# Patient Record
Sex: Female | Born: 1960 | Race: White | Hispanic: No | Smoking: Never smoker
Health system: Southern US, Community
[De-identification: ages and names within clinical notes are randomized; demographics above are authoritative.]

## PROBLEM LIST (undated history)

## (undated) DIAGNOSIS — R87619 Unspecified abnormal cytological findings in specimens from cervix uteri: Secondary | ICD-10-CM

## (undated) HISTORY — DX: Unspecified abnormal cytological findings in specimens from cervix uteri: R87.619

## (undated) HISTORY — PX: AUGMENTATION MAMMAPLASTY: SUR837

## (undated) HISTORY — PX: TONSILLECTOMY: SHX5217

---

## 1999-10-21 ENCOUNTER — Other Ambulatory Visit: Admission: RE | Admit: 1999-10-21 | Discharge: 1999-10-21 | Payer: Self-pay | Admitting: *Deleted

## 2000-02-21 HISTORY — PX: PLACEMENT OF BREAST IMPLANTS: SHX6334

## 2000-06-20 ENCOUNTER — Emergency Department (HOSPITAL_COMMUNITY): Admission: EM | Admit: 2000-06-20 | Discharge: 2000-06-21 | Payer: Self-pay | Admitting: *Deleted

## 2000-06-20 ENCOUNTER — Encounter: Payer: Self-pay | Admitting: Emergency Medicine

## 2000-06-26 ENCOUNTER — Emergency Department (HOSPITAL_COMMUNITY): Admission: EM | Admit: 2000-06-26 | Discharge: 2000-06-26 | Payer: Self-pay | Admitting: Emergency Medicine

## 2000-12-26 ENCOUNTER — Other Ambulatory Visit: Admission: RE | Admit: 2000-12-26 | Discharge: 2000-12-26 | Payer: Self-pay | Admitting: *Deleted

## 2001-12-31 ENCOUNTER — Other Ambulatory Visit: Admission: RE | Admit: 2001-12-31 | Discharge: 2001-12-31 | Payer: Self-pay | Admitting: Obstetrics and Gynecology

## 2002-02-03 ENCOUNTER — Encounter: Payer: Self-pay | Admitting: Obstetrics and Gynecology

## 2002-02-03 ENCOUNTER — Ambulatory Visit (HOSPITAL_COMMUNITY): Admission: RE | Admit: 2002-02-03 | Discharge: 2002-02-03 | Payer: Self-pay | Admitting: Obstetrics and Gynecology

## 2005-12-22 ENCOUNTER — Other Ambulatory Visit: Admission: RE | Admit: 2005-12-22 | Discharge: 2005-12-22 | Payer: Self-pay | Admitting: Family Medicine

## 2008-07-27 ENCOUNTER — Emergency Department (HOSPITAL_BASED_OUTPATIENT_CLINIC_OR_DEPARTMENT_OTHER): Admission: EM | Admit: 2008-07-27 | Discharge: 2008-07-27 | Payer: Self-pay | Admitting: Emergency Medicine

## 2008-07-27 ENCOUNTER — Ambulatory Visit: Payer: Self-pay | Admitting: Diagnostic Radiology

## 2008-11-19 ENCOUNTER — Other Ambulatory Visit: Admission: RE | Admit: 2008-11-19 | Discharge: 2008-11-19 | Payer: Self-pay | Admitting: Family Medicine

## 2008-11-30 ENCOUNTER — Encounter: Admission: RE | Admit: 2008-11-30 | Discharge: 2008-11-30 | Payer: Self-pay | Admitting: Family Medicine

## 2012-08-20 DIAGNOSIS — R87619 Unspecified abnormal cytological findings in specimens from cervix uteri: Secondary | ICD-10-CM

## 2012-08-20 HISTORY — DX: Unspecified abnormal cytological findings in specimens from cervix uteri: R87.619

## 2012-09-04 ENCOUNTER — Other Ambulatory Visit: Payer: Self-pay | Admitting: Family Medicine

## 2012-09-04 DIAGNOSIS — Z780 Asymptomatic menopausal state: Secondary | ICD-10-CM

## 2012-10-21 HISTORY — PX: COLPOSCOPY W/ BIOPSY / CURETTAGE: SUR283

## 2012-12-06 ENCOUNTER — Encounter (HOSPITAL_COMMUNITY): Payer: Self-pay | Admitting: Pharmacist

## 2012-12-13 ENCOUNTER — Other Ambulatory Visit (HOSPITAL_COMMUNITY): Payer: Self-pay

## 2012-12-18 ENCOUNTER — Ambulatory Visit (HOSPITAL_COMMUNITY): Admission: RE | Admit: 2012-12-18 | Payer: 59 | Source: Ambulatory Visit | Admitting: Obstetrics and Gynecology

## 2012-12-18 ENCOUNTER — Encounter (HOSPITAL_COMMUNITY): Admission: RE | Payer: Self-pay | Source: Ambulatory Visit

## 2012-12-18 SURGERY — CONE BIOPSY, CERVIX
Anesthesia: Choice

## 2013-02-10 ENCOUNTER — Other Ambulatory Visit: Payer: Self-pay | Admitting: Obstetrics and Gynecology

## 2013-02-10 ENCOUNTER — Other Ambulatory Visit (HOSPITAL_COMMUNITY)
Admission: RE | Admit: 2013-02-10 | Discharge: 2013-02-10 | Disposition: A | Discharge status: Home / Self Care | Payer: 59 | Source: Ambulatory Visit | Attending: Obstetrics and Gynecology | Admitting: Obstetrics and Gynecology

## 2013-02-10 DIAGNOSIS — R87810 Cervical high risk human papillomavirus (HPV) DNA test positive: Secondary | ICD-10-CM | POA: Insufficient documentation

## 2013-02-10 DIAGNOSIS — Z124 Encounter for screening for malignant neoplasm of cervix: Secondary | ICD-10-CM | POA: Insufficient documentation

## 2013-02-10 DIAGNOSIS — Z1151 Encounter for screening for human papillomavirus (HPV): Secondary | ICD-10-CM | POA: Insufficient documentation

## 2013-05-13 ENCOUNTER — Ambulatory Visit (INDEPENDENT_AMBULATORY_CARE_PROVIDER_SITE_OTHER): Payer: 59 | Admitting: Nurse Practitioner

## 2013-05-13 ENCOUNTER — Encounter: Payer: Self-pay | Admitting: Nurse Practitioner

## 2013-05-13 VITALS — BP 120/74 | HR 96 | Ht 61.0 in | Wt 116.0 lb

## 2013-05-13 DIAGNOSIS — R87619 Unspecified abnormal cytological findings in specimens from cervix uteri: Secondary | ICD-10-CM

## 2013-05-13 DIAGNOSIS — E2839 Other primary ovarian failure: Secondary | ICD-10-CM

## 2013-05-13 DIAGNOSIS — E288 Other ovarian dysfunction: Secondary | ICD-10-CM

## 2013-05-13 NOTE — Progress Notes (Signed)
Patient ID: Cristina Wyatt, female   DOB: 04/04/1960, 53 y.o.   MRN: [id number] 53 y.o. G2P2002 Divorced Caucasian Fe here to re-establish care for history of abnormal pap.  She has been a patient here several years ago - chart is in storage.  She has history of POF at age 18 with confirmation by labs at age 67.  Initially had vaso symptoms that stopped after age 43.  She is now divorced and in a monogamous relationship for 8 years.  There was a lapse of health care secondary to insurance as well.  At her most recent AEX in July 2014 she was found to have abnormal pap.  She was then seen by Dr. 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  She then had repeat pap 01/2013 and it showed ASCUS with + HR HPV and negative 16 & 18.  She comes back here now for a follow 3 month pap.    Patient's last menstrual period was 07/21/1996.          Sexually active: yes  The current method of family planning is post menopausal status.    Exercising: yes  Ballroom dancing Smoker:  no  Health Maintenance: Pap:  02/10/13, ASCUS, pos HR HPV, neg 16/18 MMG:  11/30/08, Bi-Rads 1: negative--per EPIC records pt unsure Colonoscopy:  Age 25 BMD:  11/30/08, low bone mass TDaP:  2007  Labs:  PCP 08/2012   reports that she has never smoked. She has never used smokeless tobacco. She reports that she drinks alcohol. She reports that she does not use illicit drugs.  History reviewed. No pertinent past medical history.  Past Surgical History  Procedure Laterality Date  . Tonsillectomy  age 33    No current outpatient prescriptions on file.   No current facility-administered medications for this visit.    Family History  Problem Relation Age of Onset  . Hypertension Father   . Diabetes Other   . Aneurysm Other     ROS:  Pertinent items are noted in HPI.  Otherwise, a comprehensive ROS was negative.  Exam:   BP 120/74   Pulse 96  Ht 5\' 1"  (1.549 m)  Wt 116 lb (52.617 kg)  BMI 21.93 kg/m2  LMP 07/21/1996 Height: 5\' 1"  (154.9 cm)  Ht Readings from Last 3 Encounters:  05/13/13 5\' 1"  (1.549 m)    General appearance: alert, cooperative and appears stated age Heart: regular rate and rhythm Abdomen: soft, non-tender; no masses,  no organomegaly    Pelvic: External genitalia:  no lesions              Urethra:  normal appearing urethra with no masses, tenderness or lesions              Bartholin's and Skene's: normal                 Vagina: normal appearing vagina with normal color and discharge, no lesions              Cervix: anteverted              Pap taken: yes Bimanual Exam:  Uterus:  normal size, contour, position, consistency, mobility, non-tender              Adnexa: no mass, fullness, tenderness               Rectovaginal: not examined  Anus:  Not examined  A:  Follow up on abnormal pap  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   [facility] and [facility] are scanned into Epic.

## 2013-05-13 NOTE — Patient Instructions (Signed)

## 2013-05-13 NOTE — Progress Notes (Signed)
Encounter reviewed by Dr. Kirstan Fentress Silva.  

## 2013-05-16 LAB — IPS PAP TEST WITH HPV

## 2013-05-19 ENCOUNTER — Other Ambulatory Visit: Payer: Self-pay | Admitting: Nurse Practitioner

## 2013-05-19 ENCOUNTER — Telehealth: Payer: Self-pay | Admitting: Nurse Practitioner

## 2013-05-19 DIAGNOSIS — R87619 Unspecified abnormal cytological findings in specimens from cervix uteri: Secondary | ICD-10-CM

## 2013-05-19 NOTE — Telephone Encounter (Signed)
Left a message on cell phone for patient to call back about labs.  This is to discuss her pap smear results.

## 2013-05-19 NOTE — Telephone Encounter (Signed)
12:42 PM Called again and left message of voice mail to call us.

## 2013-05-20 ENCOUNTER — Encounter: Payer: Self-pay | Admitting: *Deleted

## 2013-05-20 NOTE — Telephone Encounter (Signed)
05/20/13 - called her work number and left a message for her to call back to discuss labs.  This is about need for a Leep which we have tentatively scheduled for Wednesday with Dr. Hyacinth MeekerMiller.

## 2013-05-21 ENCOUNTER — Ambulatory Visit (INDEPENDENT_AMBULATORY_CARE_PROVIDER_SITE_OTHER): Payer: 59 | Admitting: Obstetrics & Gynecology

## 2013-05-21 ENCOUNTER — Encounter: Payer: Self-pay | Admitting: Obstetrics & Gynecology

## 2013-05-21 VITALS — BP 124/76 | HR 88 | Ht 61.0 in | Wt 117.0 lb

## 2013-05-21 DIAGNOSIS — R87619 Unspecified abnormal cytological findings in specimens from cervix uteri: Secondary | ICD-10-CM

## 2013-05-21 NOTE — Progress Notes (Signed)
Subjective:     Patient ID: Cristina RoesVicki Wyatt, female   DOB: 05/07/1960, 53 y.o.   MRN: [id number]  HPI 53 yo G2P2 prior patient of our practice who has most recently [facility].  Pt had abnormal Pap smear in 7/14 with AEX.  Colposopcy and ECC done 10/22/12 showed squamous dysplasia, favoring high grade SIL.  Repeat pap 12/14 was ASCUS with +HR HPV.  Pt worried about management and came for second opinion.  I have reviewed all pathology with her and really feel with the high grade findings on ECC, that LEEP is the correct management.  Pt feels since her last pap was normal, she is anxious about being this aggressive.  D/w pt proceeding with colposcopy with the knowledge that we might be right back doing the LEEP in another week.  This is her preference.   Review of Systems  All other systems reviewed and are negative.       Objective:   Physical Exam  Constitutional: She is oriented to person, place, and time. She appears well-developed and well-nourished.  Genitourinary:    Neurological: She is alert and oriented to person, place, and time.  Skin: Skin is warm and dry.  Psychiatric: She has a normal mood and affect.   Speculum placed.  3% acetic acid applied to cervix for >45 seconds.  Cervix visualized with both 7.5X and 15X magnification.  Green filter also used.  Lugols solution was used.  Findings:  Noted above.  Biopsy:  3, 8, and 10.  ECC:  was performed.  Monsel's was needed.  Excellent hemostasis was present.  Pt tolerated procedure well and all instruments were removed.  Findings noted above on picture of cervix.     Assessment:     Normal pap with +HR HPV Colpo with ECC + for possible High grade dysplasia 9/14     Plan:     Biopsies and ECC pending.  If CIN 2 or greater, will have pt return for LEEP Even if CIN 1 or less, will plan f/u Pap 6 months due to hx      In addition to procedure, about 10 minutes spent discussing hx and prior paps/pathology and current  guideline recommendations.

## 2013-05-21 NOTE — Patient Instructions (Signed)

## 2013-05-27 NOTE — Addendum Note (Signed)
Addended by: Jerene BearsMILLER, Kaoir Loree S on: 05/27/2013 02:43 AM   Modules accepted: Orders

## 2013-05-29 LAB — IPS OTHER TISSUE BIOPSY

## 2013-05-30 ENCOUNTER — Other Ambulatory Visit: Payer: Self-pay | Admitting: *Deleted

## 2013-05-30 ENCOUNTER — Other Ambulatory Visit: Payer: Self-pay | Admitting: Obstetrics & Gynecology

## 2013-05-30 DIAGNOSIS — IMO0002 Reserved for concepts with insufficient information to code with codable children: Secondary | ICD-10-CM

## 2013-05-30 DIAGNOSIS — N871 Moderate cervical dysplasia: Secondary | ICD-10-CM

## 2013-06-02 ENCOUNTER — Telehealth: Payer: Self-pay | Admitting: *Deleted

## 2013-06-02 NOTE — Telephone Encounter (Signed)
Patient called to reschedule LEEP due to work schedule.  LEEP rescheduled for 06-20-13 at 3pm.  Patient requested a late afternoon on Thursday with a weekend. Advised Dr Hyacinth MeekerMiller with ultrasounds Thursday afternoon. Offered Fri 06-20-13 at 3pm. Patient agreeable if would be able to return to work on Monday. Advised most people are fine to retrun to work the following day so I would not anticipate a problem returning on Monday following Friday procedure.   Routing to provider for final review. Patient agreeable to disposition. Will close encounter

## 2013-06-05 ENCOUNTER — Ambulatory Visit: Payer: 59 | Admitting: Obstetrics & Gynecology

## 2013-06-09 ENCOUNTER — Telehealth: Payer: Self-pay | Admitting: Obstetrics & Gynecology

## 2013-06-09 NOTE — Telephone Encounter (Signed)
Left message for patient to call back. Need to provide benefits quote for LEEP (PR $[phone])

## 2013-06-09 NOTE — Telephone Encounter (Signed)
Advised patient of out of pocket expectation for LEEP. Patient agreeable.

## 2013-06-09 NOTE — Telephone Encounter (Signed)
Patient is calling sabrina back °

## 2013-06-20 ENCOUNTER — Ambulatory Visit (INDEPENDENT_AMBULATORY_CARE_PROVIDER_SITE_OTHER): Payer: 59 | Admitting: Obstetrics & Gynecology

## 2013-06-20 ENCOUNTER — Telehealth: Payer: Self-pay | Admitting: *Deleted

## 2013-06-20 VITALS — BP 116/64 | HR 80 | Resp 16 | Wt 119.6 lb

## 2013-06-20 DIAGNOSIS — N871 Moderate cervical dysplasia: Secondary | ICD-10-CM

## 2013-06-20 HISTORY — PX: LEEP: SHX91

## 2013-06-20 NOTE — Telephone Encounter (Signed)
Patient did not receive message and arrived on time for appointment  Routing to provider for final review.  Will close encounter

## 2013-06-20 NOTE — Telephone Encounter (Signed)
[name], LMTCB.  Asking patient to come at 2pm if possible.  Per ROI, ok to leave detailed message.

## 2013-06-20 NOTE — Progress Notes (Signed)
53 y.o. DivorcedWF G2P2 here for LEEP.  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  Treatment was recommended and pt here for this.    Patient's last menstrual period was 07/21/1996. Pt is PMP.  Pre-procedure vitals: Blood pressure 116/64, pulse 80, resp. rate 16, weight 119 lb 9.6 oz (54.25 kg), last menstrual period 07/21/1996.   Procedure explained and patient's questions were invited and answered.   Consent form signed.  Pre-procedure medication:  Motrin 800mg .  Time given:  3pm  Procedure Set-up: Grounding pad located left thigh.  Cautery settings:50 cut/50coagulation.  Suction applied to coated speculum.  Procedure:  Speculum placed with good visualization of the cervix.  Colposcopy performed showing:  acetowhite lesion(s) noted at 3 and 8 o'clock and some vascular changes at 10.  No change from prior colposcopy. Cervix anesthetized using 2% Xylocaine with 1:100,000units Epinephrine.  10 cc's used.Entire transition zone with 12x15 loop in 4passes.  Deeper specimen obtained with 10x1010mm LEEP.  Specimen(s) placed on cork and labeled for pathology.  Hemostasis obtained with ball cautery and Monsel's solution.  EBL:  Minimal  Complications:none  Patient tolerated procedure well and left the office in satisfactory condition.  Plan:  After visit summary given.  Repeat pap planned depending on pathology

## 2013-06-20 NOTE — Patient Instructions (Signed)

## 2013-06-25 LAB — IPS OTHER TISSUE BIOPSY

## 2013-06-27 ENCOUNTER — Telehealth: Payer: Self-pay | Admitting: Obstetrics & Gynecology

## 2013-06-27 NOTE — Telephone Encounter (Signed)
I agree.  I spent 20 minutes with pt on the phone this morning and she did not mention this at all.   Encounter closed.

## 2013-06-27 NOTE — Telephone Encounter (Signed)
Patient had a leep procedure last friday and wants to know if it is normal to have swelling in the abdomen

## 2013-06-27 NOTE — Telephone Encounter (Signed)
Spoke with patient. Advised to monitor abdominal swelling over the weekend. Drink plenty of fluids and walk around to get bowels active. If having trouble having a bowel movement can use a stool softener. Monitor for signs of infection such as abdominal pain, fevers, discharge, nausea, and vomiting. If abdominal swelling increases or any of these other symptoms occur seek emergent care over the weekend. Can call our practice to get in contact with doctor on call over the weekend or seek immediate care in area close to her. If no other symptoms have occurred but still has abdominal swelling on Monday and is concerned we can see her in our office at that time. Patient agreeable and verbalizes understanding.  Dr.Miller, any further advice or recommendations for patient? Okay to continue with this plan?

## 2013-06-27 NOTE — Telephone Encounter (Signed)
Spoke with patient. Patient states that she spoke with Dr.Miller on the phone this morning but forgot to mention her abdominal swelling. Patient had LEEP on 06/20/13 with Dr.Miller. Swelling has been going on for a couple of days. "I can't even suck in my gut to get my pants on." Denies abdominal pain, urinary symptoms, bowel symptoms, and bleeding. Abdomen is soft to the touch and is not tender. "It has doubled in size from yesterday to today." Patient has been taking ibuprofen for swelling but has not taken any since yesterday. Advised would check with Dr.Miller about symptoms and give patient a call back with office visit information or further instructions. Patient agreeable.

## 2013-12-22 ENCOUNTER — Encounter: Payer: Self-pay | Admitting: Obstetrics & Gynecology

## 2013-12-23 ENCOUNTER — Telehealth: Payer: Self-pay | Admitting: Obstetrics & Gynecology

## 2013-12-23 NOTE — Telephone Encounter (Signed)
Pt calling to speak with someone about a credit that is on her account?

## 2013-12-23 NOTE — Telephone Encounter (Signed)
Patient called to get clarification on what her appointment is for on 11/10. It looks like an office visit only, but she said she wants to be sure. Would you mind assisting her?

## 2013-12-23 NOTE — Telephone Encounter (Signed)
Spoke with patient, she is coming in for a 6 month pap smear. States she needs to know how much this visit will be. Morrie Sheldonshley is checking on the lab portion, will you help me find out how much our portion will cost and let patient know on Weds? States she has a credit with us and asking about us cutting her a check to pay the lab part?

## 2013-12-30 ENCOUNTER — Ambulatory Visit (INDEPENDENT_AMBULATORY_CARE_PROVIDER_SITE_OTHER): Payer: 59 | Admitting: Obstetrics & Gynecology

## 2013-12-30 VITALS — BP 122/80 | HR 72 | Resp 16 | Wt 121.2 lb

## 2013-12-30 DIAGNOSIS — N871 Moderate cervical dysplasia: Secondary | ICD-10-CM

## 2013-12-31 ENCOUNTER — Encounter: Payer: Self-pay | Admitting: Obstetrics & Gynecology

## 2013-12-31 NOTE — Progress Notes (Signed)
Subjective:     Patient ID: Cristina RoesVicki Wyatt, female   DOB: 12/12/1960, 53 y.o.   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  Pt has a normal pap prior with +HRHPV.  D/W pt prior hx.  She has a lot of anxiety around this and needs to be very clear about her "plan" and what will come next if pap is abnormal.  Discussed with pt all kinds of variations of abnormal pap smears and the follow-up for each if an abnormality is present.  All questions answered.  Review of Systems  All other systems reviewed and are negative.      Objective:   Physical Exam  Constitutional: She is oriented to person, place, and time. She appears well-developed and well-nourished.  Genitourinary: Vagina normal. There is no rash, tenderness or lesion on the right labia. There is no rash, tenderness or lesion on the left labia.  Cervix is well healed.  Os stenotic.  Pap obtained.  Lymphadenopathy:       Right: No inguinal adenopathy present.       Left: No inguinal adenopathy present.  Neurological: She is alert and oriented to person, place, and time.  Skin: Skin is warm and dry.  Psychiatric: She has a normal mood and affect.       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  If normal, f/u pap with HR HPV 6 months.  ~15 minutes spent with patient >50% of time was in face to face discussion of above in addition to physical exam.

## 2014-01-01 LAB — IPS PAP SMEAR ONLY

## 2014-01-05 ENCOUNTER — Telehealth: Payer: Self-pay

## 2014-01-05 NOTE — Telephone Encounter (Signed)
Lmtcb//kn 

## 2014-01-05 NOTE — Telephone Encounter (Signed)
Pt returning call. Says she is available Tuesday or Wednesday the last appointment of the day or first after lunch.

## 2014-01-05 NOTE — Telephone Encounter (Signed)
-----   Message from Annamaria BootsMary Suzanne Miller, MD sent at 01/04/2014  2:29 PM EST ----- Left detailed message for pt that pap was normal.  Needs pap and HR HPV with AEX, 6 months.  Needs to be in 08 recall as well.

## 2014-01-06 NOTE — Telephone Encounter (Signed)
Patient notified of results. AEX/6 month pap scheduled for 06/30/14//kn

## 2014-01-12 ENCOUNTER — Other Ambulatory Visit: Payer: Self-pay

## 2014-01-12 DIAGNOSIS — Z1231 Encounter for screening mammogram for malignant neoplasm of breast: Secondary | ICD-10-CM

## 2014-01-14 ENCOUNTER — Ambulatory Visit: Payer: 59

## 2014-01-14 DIAGNOSIS — Z1231 Encounter for screening mammogram for malignant neoplasm of breast: Secondary | ICD-10-CM

## 2014-04-09 ENCOUNTER — Telehealth: Payer: Self-pay | Admitting: Obstetrics & Gynecology

## 2014-06-30 ENCOUNTER — Encounter: Payer: Self-pay | Admitting: Obstetrics & Gynecology

## 2014-06-30 ENCOUNTER — Ambulatory Visit (INDEPENDENT_AMBULATORY_CARE_PROVIDER_SITE_OTHER): Payer: 59 | Admitting: Obstetrics & Gynecology

## 2014-06-30 VITALS — BP 120/78 | HR 76 | Resp 12 | Ht 61.0 in | Wt 122.2 lb

## 2014-06-30 DIAGNOSIS — Z01419 Encounter for gynecological examination (general) (routine) without abnormal findings: Secondary | ICD-10-CM | POA: Diagnosis not present

## 2014-06-30 DIAGNOSIS — Z124 Encounter for screening for malignant neoplasm of cervix: Secondary | ICD-10-CM

## 2014-06-30 NOTE — Progress Notes (Signed)
54 y.o. [id number] DivorcedCaucasianF here for annual exam.  No vaginal bleeding.  Had lots of blood work for health insurance done done in march.  Pt reports this was all normal.     Patient's last menstrual period was 07/21/1996.          Sexually active: Yes.    The current method of family planning is post menopausal status.    Exercising: Yes.    farm work with horses, ballroom dancing Smoker:  no  Health Maintenance: Pap:  12/30/13 WNL History of abnormal Pap:  Yes h/o LEEP MMG:  01/14/14-normal Colonoscopy:  Age 69-repeat at age 54.  [facility].  Release for records signed. BMD:   11/30/08 TDaP:  12/11/05 Screening Labs: PCP, Hb today: PCP, Urine today: PCP   reports that she has never smoked. She has never used smokeless tobacco. She reports that she drinks alcohol. She reports that she does not use illicit drugs.  Past Medical History  Diagnosis Date  . Abnormal Pap smear of cervix 08/2012    Past Surgical History  Procedure Laterality Date  . Tonsillectomy  age 89  . Colposcopy w/ biopsy / curettage  10/2012    path - high grade SIL    No current outpatient prescriptions on file.   No current facility-administered medications for this visit.    Family History  Problem Relation Age of Onset  . Hypertension Father   . Diabetes Other   . Aneurysm Other     ROS:  Pertinent items are noted in HPI.  Otherwise, a comprehensive ROS was negative.  Exam:   BP 120/78 mmHg  Pulse 76  Resp 12  Ht 5\' 1"  (1.549 m)  Wt 122 lb 3.2 oz (55.43 kg)  BMI 23.10 kg/m2  LMP 07/21/1996  Weight change: +6#  Height: 5\' 1"  (154.9 cm)  Ht Readings from Last 3 Encounters:  06/30/14 5\' 1"  (1.549 m)  05/21/13 5\' 1"  (1.549 m)  05/13/13 5\' 1"  (1.549 m)    General appearance: alert, cooperative and appears stated age Head: Normocephalic, without obvious abnormality, atraumatic Neck: no adenopathy, supple, symmetrical, trachea midline and thyroid normal to inspection and  palpation Lungs: clear to auscultation bilaterally Breasts: normal appearance, no masses or tenderness , bilateral implants Heart: regular rate and rhythm Abdomen: soft, non-tender; bowel sounds normal; no masses,  no organomegaly Extremities: extremities normal, atraumatic, no cyanosis or edema Skin: Skin color, texture, turgor normal. No rashes or lesions Lymph nodes: Cervical, supraclavicular, and axillary nodes normal. No abnormal inguinal nodes palpated Neurologic: Grossly normal   Pelvic: External genitalia:  no lesions              Urethra:  normal appearing urethra with no masses, tenderness or lesions              Bartholins and Skenes: normal                 Vagina: normal appearing vagina with normal color and discharge, no lesions              Cervix: no lesions              Pap taken: Yes.   Bimanual Exam:  Uterus:  normal size, contour, position, consistency, mobility, non-tender              Adnexa: normal adnexa and no mass, fullness, tenderness               Rectovaginal: Confirms  Anus:  normal sphincter tone, no lesions  Chaperone was present for exam.  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

## 2014-07-02 LAB — IPS PAP TEST WITH HPV

## 2015-09-17 ENCOUNTER — Ambulatory Visit (INDEPENDENT_AMBULATORY_CARE_PROVIDER_SITE_OTHER): Payer: 59 | Admitting: Obstetrics & Gynecology

## 2015-09-17 ENCOUNTER — Encounter: Payer: Self-pay | Admitting: Obstetrics & Gynecology

## 2015-09-17 VITALS — BP 110/72 | HR 92 | Resp 14 | Ht 60.5 in | Wt 112.0 lb

## 2015-09-17 DIAGNOSIS — Z205 Contact with and (suspected) exposure to viral hepatitis: Secondary | ICD-10-CM | POA: Diagnosis not present

## 2015-09-17 DIAGNOSIS — Z01419 Encounter for gynecological examination (general) (routine) without abnormal findings: Secondary | ICD-10-CM

## 2015-09-17 DIAGNOSIS — Z0000 Encounter for general adult medical examination without abnormal findings: Secondary | ICD-10-CM

## 2015-09-17 DIAGNOSIS — Z124 Encounter for screening for malignant neoplasm of cervix: Secondary | ICD-10-CM | POA: Diagnosis not present

## 2015-09-17 DIAGNOSIS — N871 Moderate cervical dysplasia: Secondary | ICD-10-CM | POA: Diagnosis not present

## 2015-09-17 DIAGNOSIS — Z23 Encounter for immunization: Secondary | ICD-10-CM | POA: Diagnosis not present

## 2015-09-17 NOTE — Progress Notes (Signed)
55 y.o. [id number] DivorcedCaucasianF here for annual exam.  Denies vaginal bleeding.  Pt has lost 11# since last year.  She has been working on this.    PCP:  Dr. [place].    Patient's last menstrual period was 07/21/1996.          Sexually active: Yes.    The current method of family planning is post menopausal status.    Exercising: Yes.    dancing, bowling, camping Smoker:  no  Health Maintenance: Pap:  06/30/14 Neg. HR HPV:neg History of abnormal Pap:  Yes, LGSIL. Hx of LEEP MMG:  01/19/14 BIRADS1:neg Colonoscopy:  12/01/08 Normal - f/u 10 years  BMD:   12/01/08 Osteopenia  TDaP:  11/2005  Pneumonia vaccine(s):  No Zostavax:   No Hep C testing: Counseled about Hep C testing Screening Labs: At work    reports that she has never smoked. She has never used smokeless tobacco. She reports that she drinks alcohol. She reports that she does not use drugs.  Past Medical History:  Diagnosis Date  . Abnormal Pap smear of cervix 08/2012    Past Surgical History:  Procedure Laterality Date  . COLPOSCOPY W/ BIOPSY / CURETTAGE  10/2012   path - high grade SIL  . TONSILLECTOMY  age 22    No current outpatient prescriptions on file.   No current facility-administered medications for this visit.     Family History  Problem Relation Age of Onset  . Hypertension Father   . Diabetes Other   . Aneurysm Other     ROS:  Pertinent items are noted in HPI.  Otherwise, a comprehensive ROS was negative.  Exam:   BP 128/86 (BP Location: Right Arm, Patient Position: Sitting, Cuff Size: Normal)   Pulse 92   Resp 14   Ht 5' 0.5" (1.537 m)   Wt 112 lb (50.8 kg)   LMP 07/21/1996   BMI 21.51 kg/m     Height: 5' 0.5" (153.7 cm)  Ht Readings from Last 3 Encounters:  09/17/15 5' 0.5" (1.537 m)  06/30/14  (1.549 m)  05/21/13  (1.549 m)   Recheck BP after resting a few minutes:  110/72  General appearance: alert, cooperative and appears stated age Head: Normocephalic, without  obvious abnormality, atraumatic Neck: no adenopathy, supple, symmetrical, trachea midline and thyroid normal to inspection and palpation Lungs: clear to auscultation bilaterally Breasts: normal appearance, no masses or tenderness, bilateral implants Heart: regular rate and rhythm Abdomen: soft, non-tender; bowel sounds normal; no masses,  no organomegaly Extremities: extremities normal, atraumatic, no cyanosis or edema Skin: Skin color, texture, turgor normal. No rashes or lesions Lymph nodes: Cervical, supraclavicular, and axillary nodes normal. No abnormal inguinal nodes palpated Neurologic: Grossly normal   Pelvic: External genitalia:  no lesions              Urethra:  normal appearing urethra with no masses, tenderness or lesions              Bartholins and Skenes: normal                 Vagina: normal appearing vagina with normal color and discharge, no lesions              Cervix: no lesions              Pap taken: Yes.   Bimanual Exam:  Uterus:  normal size, contour, position, consistency, mobility, non-tender  Adnexa: normal adnexa and no mass, fullness, tenderness               Rectovaginal: Confirms               Anus:  normal sphincter tone, no lesions  Chaperone was present for exam.  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  This was negative last year.   Labs with work this year Hep C antibody Tdap Return annually or prn

## 2015-09-18 LAB — HEPATITIS C ANTIBODY: HCV Ab: NEGATIVE

## 2015-09-22 LAB — IPS PAP TEST WITH HPV

## 2015-10-21 ENCOUNTER — Other Ambulatory Visit: Payer: Self-pay | Admitting: Obstetrics & Gynecology

## 2015-10-21 DIAGNOSIS — Z1231 Encounter for screening mammogram for malignant neoplasm of breast: Secondary | ICD-10-CM

## 2015-10-22 ENCOUNTER — Ambulatory Visit (INDEPENDENT_AMBULATORY_CARE_PROVIDER_SITE_OTHER): Payer: 59

## 2015-10-22 DIAGNOSIS — Z1231 Encounter for screening mammogram for malignant neoplasm of breast: Secondary | ICD-10-CM

## 2016-10-05 ENCOUNTER — Other Ambulatory Visit: Payer: Self-pay | Admitting: Obstetrics & Gynecology

## 2016-10-05 DIAGNOSIS — Z1231 Encounter for screening mammogram for malignant neoplasm of breast: Secondary | ICD-10-CM

## 2016-11-01 ENCOUNTER — Ambulatory Visit: Payer: 59

## 2016-11-01 ENCOUNTER — Ambulatory Visit (INDEPENDENT_AMBULATORY_CARE_PROVIDER_SITE_OTHER): Payer: 59

## 2016-11-01 DIAGNOSIS — Z1231 Encounter for screening mammogram for malignant neoplasm of breast: Secondary | ICD-10-CM

## 2016-11-09 ENCOUNTER — Encounter: Payer: Self-pay | Admitting: Family Medicine

## 2016-12-15 ENCOUNTER — Ambulatory Visit: Payer: 59 | Admitting: Obstetrics & Gynecology

## 2017-02-26 ENCOUNTER — Ambulatory Visit (INDEPENDENT_AMBULATORY_CARE_PROVIDER_SITE_OTHER): Payer: 59 | Admitting: Obstetrics & Gynecology

## 2017-02-26 ENCOUNTER — Other Ambulatory Visit: Payer: Self-pay

## 2017-02-26 ENCOUNTER — Other Ambulatory Visit (HOSPITAL_COMMUNITY)
Admission: RE | Admit: 2017-02-26 | Discharge: 2017-02-26 | Disposition: A | Discharge status: Home / Self Care | Payer: 59 | Source: Ambulatory Visit | Attending: Obstetrics & Gynecology | Admitting: Obstetrics & Gynecology

## 2017-02-26 ENCOUNTER — Encounter: Payer: Self-pay | Admitting: Obstetrics & Gynecology

## 2017-02-26 VITALS — BP 120/84 | HR 68 | Resp 14 | Ht 60.5 in | Wt 99.2 lb

## 2017-02-26 DIAGNOSIS — Z01419 Encounter for gynecological examination (general) (routine) without abnormal findings: Secondary | ICD-10-CM

## 2017-02-26 DIAGNOSIS — E2839 Other primary ovarian failure: Secondary | ICD-10-CM

## 2017-02-26 DIAGNOSIS — Z124 Encounter for screening for malignant neoplasm of cervix: Secondary | ICD-10-CM

## 2017-02-26 NOTE — Progress Notes (Signed)
57 y.o. [id number] DivorcedCaucasianF here for annual exam.  Doing well.  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  D/w pt BMI and weight loss over last two years.    Denies vaginal bleeding.  Thinks she may have cracked two ribs--one about a year ago and then about three weeks.  No pain with deep inspiration or cough.  Pt feels like she did have pain with inspiration about a week ago.  She also had horse step on her finger three weeks ago.  Has been swollen for the last three weeks.  Cannot tighten fist completely.  Patient's last menstrual period was 07/21/1996.          Sexually active: Yes.    The current method of family planning is post menopausal status.    Exercising: Yes.    The patient has a physically strenuous job, but has no regular exercise apart from work.  Smoker:  no  Health Maintenance: Pap:  09/20/15 Neg. HR HPV:neg   06/30/14 Neg. HR HPV:neg  History of abnormal Pap:  Yes, LEEP  MMG:  11/02/16 BIRADS1:neg  Colonoscopy:  2010 normal. F/u 10 years  BMD:   12/01/08 Osteopenia  TDaP:  2017 Pneumonia vaccine(s):  never Shingrix:   never Hep C testing: 09/17/15 Neg  Screening Labs: done 10/2016- has a copy today, Hb today: same, Urine today: not collected    reports that  has never smoked. she has never used smokeless tobacco. She reports that she drinks alcohol. She reports that she does not use drugs.  Past Medical History:  Diagnosis Date  . Abnormal Pap smear of cervix 08/2012    Past Surgical History:  Procedure Laterality Date  . AUGMENTATION MAMMAPLASTY    . COLPOSCOPY W/ BIOPSY / CURETTAGE  10/2012   path - high grade SIL  . PLACEMENT OF BREAST IMPLANTS Bilateral 2002  . TONSILLECTOMY  age 89    No current outpatient medications on file.   No current facility-administered medications for this visit.     Family History  Problem Relation Age of Onset  . Hypertension Father   . Diabetes Other   .  Aneurysm Other     ROS:  Pertinent items are noted in HPI.  Otherwise, a comprehensive ROS was negative.  Exam:   BP 120/84 (BP Location: Right Arm, Patient Position: Sitting, Cuff Size: Normal)   Pulse 68   Resp 14   Ht 5' 0.5" (1.537 m)   Wt 99 lb 4 oz (45 kg)   LMP 07/21/1996   BMI 19.06 kg/m   Weight change: -13#  Height: 5' 0.5" (153.7 cm)  Ht Readings from Last 3 Encounters:  02/26/17 5' 0.5" (1.537 m)  09/17/15 5' 0.5" (1.537 m)  06/30/14 5\' 1"  (1.549 m)    General appearance: alert, cooperative and appears stated age Head: Normocephalic, without obvious abnormality, atraumatic Neck: no adenopathy, supple, symmetrical, trachea midline and thyroid normal to inspection and palpation Lungs: clear to auscultation bilaterally Breasts: normal appearance, no masses or tenderness Heart: regular rate and rhythm Abdomen: soft, non-tender; bowel sounds normal; no masses,  no organomegaly Extremities: extremities normal, atraumatic, no cyanosis or edema Skin: Skin color, texture, turgor normal. No rashes or lesions Lymph nodes: Cervical, supraclavicular, and axillary nodes normal. No abnormal inguinal nodes palpated Neurologic: Grossly normal   Pelvic: External genitalia:  no lesions              Urethra:  normal appearing urethra with no masses, tenderness or lesions              Bartholins and Skenes: normal                 Vagina: normal appearing vagina with normal color and discharge, no lesions              Cervix: no lesions              Pap taken: No. Bimanual Exam:  Uterus:  normal size, contour, position, consistency, mobility, non-tender              Adnexa: normal adnexa and no mass, fullness, tenderness               Rectovaginal: Confirms               Anus:  normal sphincter tone, no lesions  Chaperone was present for exam.  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  Declines having x ray or additional evaluation done  P:   Mammogram guidelines reviewed Recommend doing BMD  pap smear obtained today Vaccines and lab work up to date  Recommend pt consider nutritionist.  Declines. return annually or prn

## 2017-02-26 NOTE — Patient Instructions (Signed)
I placed the order for your bone density.  When you get your reminder about your mammogram, just scheduled both and let them know the order as been placed.

## 2017-02-28 LAB — CYTOLOGY - PAP: DIAGNOSIS: NEGATIVE

## 2017-09-18 ENCOUNTER — Other Ambulatory Visit: Payer: Self-pay | Admitting: Obstetrics & Gynecology

## 2017-09-18 DIAGNOSIS — Z1239 Encounter for other screening for malignant neoplasm of breast: Secondary | ICD-10-CM

## 2017-11-07 ENCOUNTER — Other Ambulatory Visit: Payer: Self-pay | Admitting: Obstetrics & Gynecology

## 2017-11-07 ENCOUNTER — Ambulatory Visit (INDEPENDENT_AMBULATORY_CARE_PROVIDER_SITE_OTHER): Payer: 59

## 2017-11-07 DIAGNOSIS — Z1239 Encounter for other screening for malignant neoplasm of breast: Secondary | ICD-10-CM

## 2017-11-07 DIAGNOSIS — M8589 Other specified disorders of bone density and structure, multiple sites: Secondary | ICD-10-CM

## 2017-11-07 DIAGNOSIS — Z1231 Encounter for screening mammogram for malignant neoplasm of breast: Secondary | ICD-10-CM

## 2017-11-07 DIAGNOSIS — E2839 Other primary ovarian failure: Secondary | ICD-10-CM

## 2018-04-03 ENCOUNTER — Other Ambulatory Visit (HOSPITAL_COMMUNITY)
Admission: RE | Admit: 2018-04-03 | Discharge: 2018-04-03 | Disposition: A | Discharge status: Home / Self Care | Payer: 59 | Source: Ambulatory Visit | Attending: Obstetrics & Gynecology | Admitting: Obstetrics & Gynecology

## 2018-04-03 ENCOUNTER — Other Ambulatory Visit: Payer: Self-pay

## 2018-04-03 ENCOUNTER — Ambulatory Visit (INDEPENDENT_AMBULATORY_CARE_PROVIDER_SITE_OTHER): Payer: 59 | Admitting: Obstetrics & Gynecology

## 2018-04-03 ENCOUNTER — Encounter: Payer: Self-pay | Admitting: Obstetrics & Gynecology

## 2018-04-03 VITALS — BP 112/80 | HR 84 | Resp 16 | Ht 60.5 in | Wt 121.0 lb

## 2018-04-03 DIAGNOSIS — Z124 Encounter for screening for malignant neoplasm of cervix: Secondary | ICD-10-CM

## 2018-04-03 DIAGNOSIS — R3915 Urgency of urination: Secondary | ICD-10-CM

## 2018-04-03 DIAGNOSIS — Z01419 Encounter for gynecological examination (general) (routine) without abnormal findings: Secondary | ICD-10-CM | POA: Diagnosis not present

## 2018-04-03 NOTE — Addendum Note (Signed)
Addended by: Jerene Bears on: 04/03/2018 11:58 AM   Modules accepted: Orders

## 2018-04-03 NOTE — Patient Instructions (Signed)
Cristina GottronWilda Wyatt, [facility]

## 2018-04-03 NOTE — Progress Notes (Addendum)
58 y.o. G17P2002 Divorced White or Caucasian female here for annual exam.  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  Knows I am pleased with this.  Had blood work in October.    Reports noting more issues with being able to hold urine.  If has sensation, must go.  Doesn't really leak.  Has tried to do Kegel exercises and doesn't think she is doing them.    Denies vaginal bleeding.  She did get a flu shot.    Patient's last menstrual period was 07/21/1996.          Sexually active: Yes.    The current method of family planning is post menopausal status.    Exercising: Yes.    has horses, dances, bike Smoker:  no  Health Maintenance: Pap:  02/26/17 Neg  09/20/15 neg. HR HPV:neg  History of abnormal Pap:  yes MMG:  11/07/17 BIRADS1:neg  Colonoscopy:  12/01/08 f/u 10 years.  H/o diverticulosis.   BMD:   11/07/17 Osteopenia  TDaP:  2017 Pneumonia vaccine(s):  no Shingrix:   no Hep C testing: 09/17/15 neg  Screening Labs: done with work   reports that she has never smoked. She has never used smokeless tobacco. She reports current alcohol use. She reports that she does not use drugs.  Past Medical History:  Diagnosis Date  . Abnormal Pap smear of cervix 08/2012    Past Surgical History:  Procedure Laterality Date  . AUGMENTATION MAMMAPLASTY    . COLPOSCOPY W/ BIOPSY / CURETTAGE  10/2012   path - high grade SIL  . PLACEMENT OF BREAST IMPLANTS Bilateral 2002  . TONSILLECTOMY  age 68    No current outpatient medications on file.   No current facility-administered medications for this visit.     Family History  Problem Relation Age of Onset  . Hypertension Father   . Diabetes Other   . Aneurysm Other     Review of Systems  Constitutional: Negative.   HENT: Negative.   Eyes: Negative.   Respiratory: Negative.   Cardiovascular: Negative.   Gastrointestinal: Negative.   Endocrine: Negative.   Genitourinary: Negative.   Musculoskeletal: Negative.   Skin: Negative.    Allergic/Immunologic: Negative.   Neurological: Negative.   Hematological: Negative.   Psychiatric/Behavioral: Negative.     Exam:   BP 112/80 (BP Location: Right Arm, Patient Position: Sitting, Cuff Size: Normal)   Pulse 84   Resp 16   Ht 5' 0.5" (1.537 m)   Wt 121 lb (54.9 kg)   LMP 07/21/1996   BMI 23.24 kg/m   Height: 5' 0.5" (153.7 cm)  Ht Readings from Last 3 Encounters:  04/03/18 5' 0.5" (1.537 m)  02/26/17 5' 0.5" (1.537 m)  09/17/15 5' 0.5" (1.537 m)    General appearance: alert, cooperative and appears stated age Head: Normocephalic, without obvious abnormality, atraumatic Neck: no adenopathy, supple, symmetrical, trachea midline and thyroid normal to inspection and palpation Lungs: clear to auscultation bilaterally Breasts: normal appearance, no masses or tenderness , bilateral breast implants Heart: regular rate and rhythm Abdomen: soft, non-tender; bowel sounds normal; no masses,  no organomegaly Extremities: extremities normal, atraumatic, no cyanosis or edema Skin: Skin color, texture, turgor normal. No rashes or lesions Lymph nodes: Cervical, supraclavicular, and axillary nodes normal. No abnormal inguinal nodes palpated Neurologic: Grossly normal  Pelvic: External genitalia:  no lesions              Urethra:  normal appearing urethra with no masses,  tenderness or lesions              Bartholins and Skenes: normal                 Vagina: normal appearing vagina with normal color and discharge, no lesions               Cervix: no lesions              Pap taken: Yes.   Bimanual Exam:  Uterus:  normal size, contour, position, consistency, mobility, non-tender    pt cannot isolate pelvic floor muscles and cannot perform a Kegel exercise              Adnexa: normal adnexa and no mass, fullness, tenderness               Rectovaginal: Confirms               Anus:  normal sphincter tone, no lesions  Chaperone was present for exam.  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  Pt aware this year. Shingrix vaccination discussed.  Not interested in this today. Consider BMD in 2-3 more years Lab work done with work Referral to PT Return annually or prn

## 2018-04-09 LAB — CYTOLOGY - PAP
DIAGNOSIS: UNDETERMINED — AB
HPV (WINDOPATH): DETECTED — AB

## 2018-04-11 ENCOUNTER — Other Ambulatory Visit: Payer: Self-pay | Admitting: *Deleted

## 2018-04-11 ENCOUNTER — Encounter: Payer: Self-pay | Admitting: Obstetrics & Gynecology

## 2018-04-11 ENCOUNTER — Telehealth: Payer: Self-pay | Admitting: Obstetrics & Gynecology

## 2018-04-11 DIAGNOSIS — R87610 Atypical squamous cells of undetermined significance on cytologic smear of cervix (ASC-US): Secondary | ICD-10-CM

## 2018-04-11 DIAGNOSIS — R87810 Cervical high risk human papillomavirus (HPV) DNA test positive: Principal | ICD-10-CM

## 2018-04-11 NOTE — Telephone Encounter (Signed)
Patient states she has additional questions regarding procedure scheduled for 05/07/2018. States she would prefer to speak with Dr. Hyacinth Meeker directly.

## 2018-04-11 NOTE — Telephone Encounter (Signed)
Routing to Dr. Miller

## 2018-04-11 NOTE — Telephone Encounter (Signed)
Patient is returning a call to [place]. Patient states she will send a MyChart message to Dr.Miller with her questions.

## 2018-04-11 NOTE — Telephone Encounter (Signed)
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   Routing to Dr. Hyacinth Meeker for return call.

## 2018-04-11 NOTE — Telephone Encounter (Signed)
Patient sent the following correspondence through MyChart. Routing to Dr. Hyacinth Meeker to assist patient with request.  Please, I have a comfort level with Dr. Hyacinth Meeker I have just a couple of questions. If she can not return a call to me then I will need to schedule a 10-15 min face to face. The questions are in regards to my low number abnormal test. Thank you.

## 2018-04-11 NOTE — Telephone Encounter (Signed)
Left message to call Zaheer Wageman, RN at [facility] [phone].   

## 2018-04-17 ENCOUNTER — Telehealth: Payer: Self-pay | Admitting: Obstetrics & Gynecology

## 2018-04-17 NOTE — Telephone Encounter (Signed)
[name]. Advised calling on behalf of Dr Hyacinth Meeker. Can answer questions or schedule consult. Patient has multiple questions regarding pap results and colpo procedure. Reviewed these and questions answered. Patient then has multiple questions regarding HPV transmission and new exposure. Discussed as much as possible; however questions extended beyond what RN can answer.  Consult appointment scheduled for 04-18-2018 at 2pm with Dr Hyacinth Meeker.  Call time 25 minutes.    Routing to Dr Hyacinth Meeker. Encounter closed.

## 2018-04-17 NOTE — Telephone Encounter (Signed)
Call placed to patient to convey benefits for colposcopy. Patient understands and is agreeable with the benefits. Patient states she sent a my chart message on 04/11/18 and has not received a return call yet. Patient has some questions for Dr. Hyacinth Meeker.

## 2018-04-18 ENCOUNTER — Ambulatory Visit (INDEPENDENT_AMBULATORY_CARE_PROVIDER_SITE_OTHER): Payer: 59 | Admitting: Obstetrics & Gynecology

## 2018-04-18 VITALS — BP 122/90 | HR 84 | Resp 14

## 2018-04-18 DIAGNOSIS — R87610 Atypical squamous cells of undetermined significance on cytologic smear of cervix (ASC-US): Secondary | ICD-10-CM | POA: Diagnosis not present

## 2018-04-18 DIAGNOSIS — R87810 Cervical high risk human papillomavirus (HPV) DNA test positive: Secondary | ICD-10-CM

## 2018-04-18 DIAGNOSIS — Z202 Contact with and (suspected) exposure to infections with a predominantly sexual mode of transmission: Secondary | ICD-10-CM | POA: Diagnosis not present

## 2018-04-18 NOTE — Progress Notes (Signed)
GYNECOLOGY  VISIT  CC:   Discuss pap smear results  HPI: 58 y.o. G44P2002 Divorced White or Caucasian female here for discussion of recent pap smear.  H/o Cin 2 in 2015 treated with LEEP.  Has +HR HPV at that time.  Since then, all pap smears have been normal with negative HR HPV.  Had only one partner during this time although he took a two year break from their relationship.  She feels he had a homosexual relationship during this time and is concerned this may have continued.  Unsure if this is with the same or a different partner.  Nature of HPV and transmission as well as likelihood that she's had a new HPV exposure due to five years or normal pap smears and negative HR HPV testing.  She does need to proceed with a colposcopy.  She wants to know possible treatment options as well.  Guidelines for management reviewed with ASCCP recommendations.  Pt and I also discussed importance of STD testing especially is partner is in another relationship with a female or female.  She has many questions which were addressed.  GYNECOLOGIC HISTORY: Patient's last menstrual period was 07/21/1996. Contraception: PMP Menopausal hormone therapy: none  Patient Active Problem List   Diagnosis Date Noted  . Abnormal Pap smear of cervix 05/13/2013  . 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 Abnormal Pap smear of cervix 08/2012    Past Surgical History:  Procedure Laterality Date  . AUGMENTATION MAMMAPLASTY    . COLPOSCOPY W/ BIOPSY / CURETTAGE  10/2012   path - high grade SIL  . PLACEMENT OF BREAST IMPLANTS Bilateral 2002  . TONSILLECTOMY  age 39    MEDS:   No current outpatient medications on file prior to visit.   No current facility-administered medications on file prior to visit.     ALLERGIES: Other  Family History  Problem Relation Age of Onset  . Hypertension Father   . Diabetes Other   . Aneurysm Other     SH:  Divorced, non smoker  Review of  Systems  All other systems reviewed and are negative.   PHYSICAL EXAMINATION:    BP 122/90 (BP Location: Left Arm, Patient Position: Sitting, Cuff Size: Normal)   Pulse 84   Resp 14   LMP 07/21/1996     Physical Exam  Constitutional: She is oriented to person, place, and time and well-developed, well-nourished, and in no distress.  Neurological: She is alert and oriented to person, place, and time.  Psychiatric: Mood and affect normal.    Assessment: Newly abnormal pap smear with ASCUS with +HR HPV findings  STD exposure Anxiety about the above  Plan: GC/CHl, trich obtained today via urine  HIV, RPR, Hep B and C obtained She is going to return for colposcopy  Lengthy visit with patient due to her questions and concerns.  30 minutes spent with pt in direct face to face discussion.

## 2018-04-19 LAB — HEP, RPR, HIV PANEL
HEP B S AG: NEGATIVE
HIV Screen 4th Generation wRfx: NONREACTIVE
RPR Ser Ql: NONREACTIVE

## 2018-04-20 ENCOUNTER — Encounter: Payer: Self-pay | Admitting: Obstetrics & Gynecology

## 2018-04-21 LAB — CHLAMYDIA/GONOCOCCUS/TRICHOMONAS, NAA
Chlamydia by NAA: NEGATIVE
Gonococcus by NAA: NEGATIVE
Trich vag by NAA: NEGATIVE

## 2018-04-23 LAB — HEPATITIS C ANTIBODY: Hep C Virus Ab: <0.1 s/co ratio (ref 0.0–0.9)

## 2018-04-23 LAB — SPECIMEN STATUS REPORT

## 2018-04-30 ENCOUNTER — Ambulatory Visit: Payer: 59 | Admitting: Obstetrics & Gynecology

## 2018-05-07 ENCOUNTER — Other Ambulatory Visit: Payer: Self-pay

## 2018-05-07 ENCOUNTER — Ambulatory Visit (INDEPENDENT_AMBULATORY_CARE_PROVIDER_SITE_OTHER): Payer: 59 | Admitting: Obstetrics & Gynecology

## 2018-05-07 ENCOUNTER — Encounter: Payer: Self-pay | Admitting: Obstetrics & Gynecology

## 2018-05-07 DIAGNOSIS — R87810 Cervical high risk human papillomavirus (HPV) DNA test positive: Secondary | ICD-10-CM

## 2018-05-07 DIAGNOSIS — R87610 Atypical squamous cells of undetermined significance on cytologic smear of cervix (ASC-US): Secondary | ICD-10-CM | POA: Diagnosis not present

## 2018-05-07 NOTE — Patient Instructions (Signed)

## 2018-05-07 NOTE — Progress Notes (Signed)
58 y.o. G2P2 Divorced [place] female here for colposcopy with possible biopsies and/or ECC due to ASCUS with +HR HPV Pap obtained at [facility] on 04/03/18.    Pt continues to have questions about HR HPV, how it is acquired and how she possible acquired it a second time.  Her significant other will not communicate about this with her.  Additional questions answered today.    Pt was seen on 2/272/2020 to discuss pap findings due to her many concerns.  About 30 minutes then spent in discussion.  Additional time again today spent addressing several questions.  This was out of the ordinary time spent with a patient while performing a colposcopy.    Patient's last menstrual period was 07/21/1996.          Sexually active: Yes.    The current method of family planning is post menopausal status.     Patient has been counseled about results and procedure.  Risks and benefits have bene reviewed including immediate and/or delayed bleeding, infection, cervical scaring from procedure, possibility of needing additional follow up as well as treatment.  rare risks of missing a lesion discussed as well.  All questions answered.  Pt ready to proceed.  BP 120/80 (BP Location: Right Arm, Patient Position: Sitting, Cuff Size: Normal)   Pulse 76   Ht 5' 0.5" (1.537 m)   Wt 124 lb (56.2 kg)   LMP 07/21/1996   BMI 23.82 kg/m   Physical Exam  Constitutional: She is oriented to person, place, and time. She appears well-developed and well-nourished.  Genitourinary:    Vagina normal.  There is no rash, tenderness, lesion or injury on the right labia. There is no rash, tenderness, lesion or injury on the left labia.  Lymphadenopathy:       Right: No inguinal adenopathy present.       Left: No inguinal adenopathy present.  Neurological: She is alert and oriented to person, place, and time.  Skin: Skin is warm and dry.  Psychiatric: She has a normal mood and affect.    Speculum placed.  3% acetic acid applied to cervix  for >45 seconds.  Cervix visualized with both 7.5X and 15X magnification.  Green filter also used.  Lugols solution was used.  Findings:  No AWE or abnormal staining on ectocervix noted.  Biopsy:  Not obtained.  ECC:  was performed.  Monsel's was not needed.  Excellent hemostasis was present.  Pt tolerated procedure well and all instruments were removed.   Assessment:  ASCUS with +HR HPV obtained today  Plan:  Pathology results will be called to patient and follow-up planned pending results.  Additional 15 minutes spent with pt discussing concerns and questions which is above and in addition to the typical time spent with a pt with a colposcopy.

## 2018-05-13 ENCOUNTER — Telehealth: Payer: Self-pay

## 2018-05-13 NOTE — Telephone Encounter (Signed)
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  Because this was not perfectly clear, I want to repeat a pap in six months and then a pap and HR HPV with [facility] 1 year.  Please make sure appts are scheduled and one year follow up is about at one year.  Needs recalls placed as well.  Thanks.

## 2018-05-13 NOTE — Telephone Encounter (Signed)
Spoke with patient. Results given. Patient verbalizes understanding. 6 month office visit for pap scheduled for 11/12/2018 at 12:45 pm with Dr.Miller. Aex scheduled for 05/16/2019 at 11:30 am with Dr.Miller. Patient is agreeable to date and time. 06 recall and 08 recall entered. Encounter closed.

## 2018-09-24 ENCOUNTER — Other Ambulatory Visit: Payer: Self-pay | Admitting: Obstetrics & Gynecology

## 2018-09-24 DIAGNOSIS — Z1231 Encounter for screening mammogram for malignant neoplasm of breast: Secondary | ICD-10-CM

## 2018-09-30 ENCOUNTER — Telehealth: Payer: Self-pay | Admitting: Obstetrics & Gynecology

## 2018-09-30 NOTE — Telephone Encounter (Signed)
Patient called and cancelled 6 month pap

## 2018-09-30 NOTE — Telephone Encounter (Signed)
[name] regarding rescheduling 6 month pap.   Patient reports multiple personal concerns related to Covid 19 that have impacted her ability to keep appointment.  Reviewed previous path report and Dr Ammie Ferrier recommendation for follow-up pap. Stressed importance to monitor for progression versus regression of cervical disease.  Patient requests call from business office.

## 2018-10-02 NOTE — Telephone Encounter (Signed)
Patient sent the following correspondence through [place].    Appointment Request From: Cathlean Marseilles. Danne Baxter  With Provider: Megan Salon, MD [facility]]  Preferred Date Range: From 12/02/2018 To 01/03/2019  Preferred Times: Monday Morning, Tuesday Morning, Wednesday Morning, Thursday Morning, Friday Morning  Reason: To address the following health maintenance concerns. Colonoscopy  Comments: I would like to have this done in the [place] location. Please

## 2018-10-09 NOTE — Telephone Encounter (Signed)
Call placed to patient to follow up in regards to re-scheduling 6 month follow up pap and to address any insurance concerns regarding appointment. Left voicemail message requesting a return call

## 2018-11-12 ENCOUNTER — Ambulatory Visit: Payer: 59 | Admitting: Obstetrics & Gynecology

## 2018-11-13 ENCOUNTER — Ambulatory Visit (INDEPENDENT_AMBULATORY_CARE_PROVIDER_SITE_OTHER): Payer: 59

## 2018-11-13 ENCOUNTER — Other Ambulatory Visit: Payer: Self-pay

## 2018-11-13 DIAGNOSIS — Z1231 Encounter for screening mammogram for malignant neoplasm of breast: Secondary | ICD-10-CM | POA: Diagnosis not present

## 2018-12-19 NOTE — Telephone Encounter (Signed)
Spoke with pt. Followed up from 10/09/18 phone encounter. Pt expressed not able to come in for 6 month follow up pap due to financial reasons having not met her deductible and covid.   Pt states plans to still keep appt with [facility] in 04/2019 for [facility]. Has been taking vitamins recommended by Dr Sabra Heck since last visit in 03/2018 and has had MMG in [place] last month. Pt has appt [facility] on 12/23/18.   Will route to Dr Lestine Box.   Will close encounter.  CC: Cristina Wyatt

## 2019-01-11 NOTE — Telephone Encounter (Signed)
Pt is clearly aware of recommendations.  Please remove from current pap recall and make sure there is a recall placed for 04/2019 for pap follow up.  Thanks.

## 2019-01-13 NOTE — Telephone Encounter (Signed)
Recall placed for 04/2019 for pap follow up. [facility] scheduled 05/16/2019. Pt aware. Will close addendum.

## 2019-05-02 ENCOUNTER — Ambulatory Visit: Payer: 59 | Attending: Internal Medicine

## 2019-05-02 DIAGNOSIS — Z23 Encounter for immunization: Secondary | ICD-10-CM

## 2019-05-02 NOTE — Progress Notes (Signed)
   Covid-19 Vaccination Clinic  Name:  Cristina Wyatt    MRN: [id number] DOB: [birth year]  05/02/2019  [name] was observed post Covid-19 immunization for 15 minutes without incident. She was provided with Vaccine Information Sheet and instruction to access the V-Safe system.   [name] was instructed to call 911 with any severe reactions post vaccine: Marland Kitchen Difficulty breathing  . Swelling of face and throat  . A fast heartbeat  . A bad rash all over body  . Dizziness and weakness   Immunizations Administered    Name Date Dose VIS Date Route   Pfizer COVID-19 Vaccine 05/02/2019  2:18 PM 0.3 mL 01/31/2019 Intramuscular   Manufacturer: [employer], [employer]   Lot: [id number]   NDC: [address]-0

## 2019-05-16 ENCOUNTER — Ambulatory Visit (INDEPENDENT_AMBULATORY_CARE_PROVIDER_SITE_OTHER): Payer: 59 | Admitting: Obstetrics & Gynecology

## 2019-05-16 ENCOUNTER — Other Ambulatory Visit (HOSPITAL_COMMUNITY)
Admission: RE | Admit: 2019-05-16 | Discharge: 2019-05-16 | Disposition: A | Discharge status: Home / Self Care | Payer: 59 | Source: Ambulatory Visit | Attending: Obstetrics & Gynecology | Admitting: Obstetrics & Gynecology

## 2019-05-16 ENCOUNTER — Encounter: Payer: Self-pay | Admitting: Obstetrics & Gynecology

## 2019-05-16 ENCOUNTER — Other Ambulatory Visit: Payer: Self-pay

## 2019-05-16 VITALS — BP 118/80 | HR 70 | Temp 97.6°F | Resp 16 | Ht 60.5 in | Wt 133.0 lb

## 2019-05-16 DIAGNOSIS — Z780 Asymptomatic menopausal state: Secondary | ICD-10-CM | POA: Insufficient documentation

## 2019-05-16 DIAGNOSIS — R87612 Low grade squamous intraepithelial lesion on cytologic smear of cervix (LGSIL): Secondary | ICD-10-CM | POA: Insufficient documentation

## 2019-05-16 DIAGNOSIS — R87610 Atypical squamous cells of undetermined significance on cytologic smear of cervix (ASC-US): Secondary | ICD-10-CM

## 2019-05-16 DIAGNOSIS — R87810 Cervical high risk human papillomavirus (HPV) DNA test positive: Secondary | ICD-10-CM | POA: Diagnosis not present

## 2019-05-16 DIAGNOSIS — B977 Papillomavirus as the cause of diseases classified elsewhere: Secondary | ICD-10-CM | POA: Diagnosis not present

## 2019-05-16 DIAGNOSIS — Z01419 Encounter for gynecological examination (general) (routine) without abnormal findings: Secondary | ICD-10-CM | POA: Diagnosis not present

## 2019-05-16 NOTE — Progress Notes (Signed)
59 y.o. G39P2002 Divorced White or Caucasian female here for annual exam.  Doing well.  Denies vaginal bleeding.  Will do blood work with work.  Has received the first [place] Covid vaccination.  Patient's last menstrual period was 07/21/1996.          Sexually active: Yes.    The current method of family planning is post menopausal status.    Exercising: Yes.    horseback riding, farm Smoker:  no  Health Maintenance: Pap:  02-26-17 neg HPV HR neg, 04-03-2018 ASCUS HPV HR+ History of abnormal Pap:  yes MMG:  11-14-2018 category b density birads 1:neg Colonoscopy:  11/2/202 neg, follow up 10 years.  Dr. Shary Key BMD:   2019 osteopenia TDaP:  09/17/2015 Pneumonia vaccine(s):  Not done Shingrix:   D/w pt.  She is waiting until covid vaccination is completed Hep C testing: hep c neg 2020 Screening Labs: will do with work   reports that she has never smoked. She has never used smokeless tobacco. She reports current alcohol use. She reports that she does not use drugs.  Past Medical History:  Diagnosis Date  . Abnormal Pap smear of cervix 08/2012    Past Surgical History:  Procedure Laterality Date  . AUGMENTATION MAMMAPLASTY    . COLPOSCOPY W/ BIOPSY / CURETTAGE  10/2012   path - high grade SIL  . PLACEMENT OF BREAST IMPLANTS Bilateral 2002  . TONSILLECTOMY  age 18    Current Outpatient Medications  Medication Sig Dispense Refill  . Cyanocobalamin (B-12 PO) Take by mouth.    . FOLIC ACID PO Take by mouth daily.    . Multiple Vitamins-Minerals (ZINC PO) Take by mouth.     No current facility-administered medications for this visit.    Family History  Problem Relation Age of Onset  . Hypertension Father   . Diabetes Other   . Aneurysm Other     Review of Systems  All other systems reviewed and are negative.   Exam:   Vitals:   05/16/19 1132  BP: 118/80  Pulse: 70  Resp: 16  Temp: 97.6 F (36.4 C)    General appearance: alert, cooperative and appears stated age Head:  Normocephalic, without obvious abnormality, atraumatic Neck: no adenopathy, supple, symmetrical, trachea midline and thyroid normal to inspection and palpation Lungs: clear to auscultation bilaterally Breasts: normal appearance, no masses or tenderness Heart: regular rate and rhythm Abdomen: soft, non-tender; bowel sounds normal; no masses,  no organomegaly Extremities: extremities normal, atraumatic, no cyanosis or edema Skin: Skin color, texture, turgor normal. No rashes or lesions Lymph nodes: Cervical, supraclavicular, and axillary nodes normal. No abnormal inguinal nodes palpated Neurologic: Grossly normal   Pelvic: External genitalia:  no lesions              Urethra:  normal appearing urethra with no masses, tenderness or lesions              Bartholins and Skenes: normal                 Vagina: normal appearing vagina with normal color and discharge, no lesions              Cervix: no lesions              Pap taken: Yes.   Bimanual Exam:  Uterus:  normal size, contour, position, consistency, mobility, non-tender              Adnexa: normal adnexa and no mass, fullness,  tenderness               Rectovaginal: Confirms               Anus:  normal sphincter tone, no lesions  Chaperone, Zenovia Jordan, CMA, was present for exam.  A:  Well Woman with normal exam H/o LEEP 5/16 to CIN 2 Osteopenia H/o POF age 59 Breast implants  P:   Mammogram guidelines reviewed.  MMG up to date.  Doing 3D. Pap with HR HPV obtained today Colonoscopy neg 2020 Shingrix vaccination discussed.  She is going to wait until completing the Covid vaccination Plan repeating BMD 1-2 years Labs will done with work Return annually or prn

## 2019-05-22 LAB — CYTOLOGY - PAP
Comment: NEGATIVE
Comment: NEGATIVE
HPV 16: NEGATIVE
HPV 18 / 45: NEGATIVE
High risk HPV: POSITIVE — AB

## 2019-05-26 ENCOUNTER — Telehealth: Payer: Self-pay | Admitting: *Deleted

## 2019-05-26 DIAGNOSIS — R87612 Low grade squamous intraepithelial lesion on cytologic smear of cervix (LGSIL): Secondary | ICD-10-CM

## 2019-05-26 NOTE — Telephone Encounter (Signed)
[name].  Advised of Pap result and recommendation for colposcopy. Procedure reviewed. Patient has had this previously.Aware to take Motrin prior to appointment with food. 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

## 2019-05-26 NOTE — Telephone Encounter (Signed)
-----   Message from Romualdo Bolk, MD sent at 05/22/2019  5:43 PM EDT ----- Please inform and set her up for a colposcopy with Dr Hyacinth Meeker

## 2019-05-27 ENCOUNTER — Ambulatory Visit: Payer: 59 | Attending: Internal Medicine

## 2019-05-27 DIAGNOSIS — Z23 Encounter for immunization: Secondary | ICD-10-CM

## 2019-05-27 NOTE — Telephone Encounter (Signed)
Call placed to convey benefits for recommend procedure.

## 2019-05-27 NOTE — Progress Notes (Signed)
   Covid-19 Vaccination [facility]  Name:  Cristina Wyatt    MRN: [id number] DOB: 1960-12-10  05/27/2019  [name] was observed post Covid-19 immunization for 15 minutes without incident. She was provided with Vaccine Information Sheet and instruction to access the V-Safe system.   [name] was instructed to call 911 with any severe reactions post vaccine: Marland Kitchen Difficulty breathing  . Swelling of face and throat  . A fast heartbeat  . A bad rash all over body  . Dizziness and weakness   Immunizations Administered    Name Date Dose VIS Date Route   Pfizer COVID-19 Vaccine 05/27/2019  2:07 PM 0.3 mL 01/31/2019 Intramuscular   Manufacturer: [employer], [employer]   Lot: [id number]   NDC: [address]-8

## 2019-05-30 NOTE — Telephone Encounter (Signed)
Patient returned my call and I conveyed the benefits. Patient understands/agreeable with the benefits. Patient declines scheduling at this time. Patient states she will call back when she is able to schedule.

## 2019-06-04 NOTE — Telephone Encounter (Signed)
Routing to Dr.Jertson for review. 

## 2019-06-05 NOTE — Telephone Encounter (Signed)
Pap read by Dr Oscar La while Dr Hyacinth Meeker out of office.   Patient of Dr Hyacinth Meeker. [facility] completed 05-16-19. Please advise on next step. Patient declines to schedule colpo.

## 2019-06-16 NOTE — Telephone Encounter (Signed)
Routing to Dr Hyacinth Meeker for review. Patient has declined colpo.  Recall is placed.

## 2019-08-08 ENCOUNTER — Telehealth: Payer: Self-pay

## 2019-08-08 NOTE — Telephone Encounter (Signed)
Left message to call Stacia Feazell at [phone].  Patient is in 06 recall. Patient had a LGSIL pap with POS HPV on 05/16/2019. A colposcopy was recommended. Patient spoke with the business office and declined to schedule in 05/2019. Patient was placed in recall for follow up on scheduling.

## 2019-08-15 ENCOUNTER — Ambulatory Visit: Payer: 59 | Admitting: Obstetrics & Gynecology

## 2019-08-19 NOTE — Telephone Encounter (Signed)
Please let the patient know that I am covering for Dr Hyacinth Meeker and would definitely recommend that she proceed with colposcopy. I have not seen anything in the GYN literature to suggest that the covid vaccine will clear HPV. It is definitely the guideline to repeat the colposcopy.

## 2019-08-19 NOTE — Telephone Encounter (Signed)
Spoke with patient. Patient states that she read/saw that receiving 2 doses of the COVID vaccine can help your body to heal other things in your body. Patient wants to have a repeat pap smear before proceeding with a colposcopy to see if the COVID vaccine could have changed her pap smear. Advised thus far I have not heard of this being the case and that with her having 2 abnormal paps in 2 years it will likely still be the recommendation to have a colposcopy. Advised retesting 3 months after last pap is not recommended as it is too soon for the pap to change. Advised will review with Dr.Miller and return call.

## 2019-08-21 NOTE — Telephone Encounter (Signed)
Spoke with patient. Message given to patient as seen below from Dr.Jertson. Patient verbalizes understanding. Patient would like to review benefits again before scheduling. Advised I will have someone call her to discuss these before proceeding with scheduling.

## 2019-08-26 NOTE — Telephone Encounter (Signed)
[name]. Per DPR, OK to leave message on voicemail.   Left voicemail requesting a return call to [facility] to review benefits and schedule recommended Colposcopy with M. Leda Quail, MD

## 2019-09-01 NOTE — Telephone Encounter (Signed)
Rosa, would you mind trying to reach this patient again to discuss benefits?

## 2019-09-15 NOTE — Telephone Encounter (Signed)
Cristina Wyatt or Cristina Wyatt, Can we try to reach this patient again regarding benefits?

## 2019-09-15 NOTE — Telephone Encounter (Signed)
Spoke with patient regarding benefits for recommended colposcopy. Patient acknowledges understanding of information presented. Patient is aware of cancellation policy. Patient scheduled appointment for 10/09/2019 at 0430PM with M. Leda Quail, MD. Patient declined earlier appointments offered.   Routing to Dr. Hyacinth Meeker and Yvonna Alanis, RN, for [facility].  Encounter closed.

## 2019-09-30 ENCOUNTER — Other Ambulatory Visit: Payer: Self-pay | Admitting: Obstetrics & Gynecology

## 2019-09-30 ENCOUNTER — Other Ambulatory Visit (HOSPITAL_BASED_OUTPATIENT_CLINIC_OR_DEPARTMENT_OTHER): Payer: Self-pay | Admitting: Obstetrics & Gynecology

## 2019-09-30 DIAGNOSIS — Z1231 Encounter for screening mammogram for malignant neoplasm of breast: Secondary | ICD-10-CM

## 2019-10-09 ENCOUNTER — Other Ambulatory Visit (HOSPITAL_COMMUNITY)
Admission: RE | Admit: 2019-10-09 | Discharge: 2019-10-09 | Disposition: A | Discharge status: Home / Self Care | Payer: 59 | Source: Ambulatory Visit | Attending: Obstetrics & Gynecology | Admitting: Obstetrics & Gynecology

## 2019-10-09 ENCOUNTER — Encounter: Payer: Self-pay | Admitting: Obstetrics & Gynecology

## 2019-10-09 ENCOUNTER — Ambulatory Visit (INDEPENDENT_AMBULATORY_CARE_PROVIDER_SITE_OTHER): Payer: 59 | Admitting: Obstetrics & Gynecology

## 2019-10-09 ENCOUNTER — Other Ambulatory Visit: Payer: Self-pay

## 2019-10-09 VITALS — BP 110/72 | HR 70 | Resp 16 | Wt 131.0 lb

## 2019-10-09 DIAGNOSIS — R87612 Low grade squamous intraepithelial lesion on cytologic smear of cervix (LGSIL): Secondary | ICD-10-CM | POA: Diagnosis not present

## 2019-10-09 DIAGNOSIS — B977 Papillomavirus as the cause of diseases classified elsewhere: Secondary | ICD-10-CM | POA: Diagnosis not present

## 2019-10-09 DIAGNOSIS — N871 Moderate cervical dysplasia: Secondary | ICD-10-CM | POA: Diagnosis not present

## 2019-10-09 NOTE — Progress Notes (Signed)
59 y.o. Divorced Not Hispanic or Latino female here for colposcopy with possible biopsies and/or ECC due to LGSIL Pap with HR HPV obtained 05/16/2019 at [facility].  She does have questions about her pap smear.  These were all answered.    Patient's last menstrual period was 07/21/1996.          Sexually active: Yes.    The current method of family planning is post menopausal status.     Patient has been counseled about results and procedure.  Risks and benefits have bene reviewed including immediate and/or delayed bleeding, infection, cervical scaring from procedure, possibility of needing additional follow up as well as treatment.  rare risks of missing a lesion discussed as well.  All questions answered.  Pt ready to proceed.  BP 110/72   Pulse 70   Resp 16   Wt 131 lb (59.4 kg)   LMP 07/21/1996   BMI 25.16 kg/m   Physical Exam Exam conducted with a chaperone present.  Constitutional:      Appearance: Normal appearance.  Genitourinary:    General: Normal vulva.     Cervix: Normal.  Neurological:     General: No focal deficit present.     Mental Status: She is alert.     Speculum placed.  3% acetic acid applied to cervix for >45 seconds.  Cervix visualized with both 7.5X and 15X magnification.  Green filter also used.  Lugol's solution was used.  Findings:  No AWE or abnormal staining with Lugol's solution.  Biopsy:  none.  ECC:  was performed.  Cervical stenosis was present so dilation of cervix was necessary prior to ECC.  Pt did have cramping with this but it was tolerated well.  Monsel's was needed.  Excellent hemostasis was present.  Pt tolerated procedure well and all instruments were removed.    Chaperon, Cornelia Copa, CMA, was present during procedure.  Assessment:  LGSIL pap with +HR HPV  Plan:  Pathology results will be called to patient and follow-up planned pending results.

## 2019-10-12 ENCOUNTER — Encounter: Payer: Self-pay | Admitting: Obstetrics & Gynecology

## 2019-10-13 LAB — SURGICAL PATHOLOGY

## 2019-10-14 ENCOUNTER — Telehealth: Payer: Self-pay | Admitting: *Deleted

## 2019-10-14 DIAGNOSIS — N870 Mild cervical dysplasia: Secondary | ICD-10-CM

## 2019-10-14 NOTE — Telephone Encounter (Addendum)
Spoke with patient. Advised of results as seen below per Dr. Hyacinth Meeker.  Patient is familiar with LEEP procedure. Questions answered.  Patient is agreeable to LEEP, declines to schedule at this time, wants to know if it can possibly be deferred 4 months?  Patient has additional questions for business office before proceeding with scheduling. Patient declines return call from Dr. Hyacinth Meeker at this time.  Advised patient I will f/u with recommendations. Patient agreeable.    Order for LEEP placed.  Message to  business office for return call.   Dr. Hyacinth Meeker -please advise on deferring LEEP.

## 2019-10-14 NOTE — Telephone Encounter (Signed)
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  She is not going to want to do this but this is my recommendation.  I may need to call her.  Please let me know if you think that would help.  Thanks.  CC:  Cornelia Copa, CMA

## 2019-10-14 NOTE — Telephone Encounter (Signed)
Leda Min, RN  10/14/2019 8:35 AM EDT Back to Top    Left message to call Noreene Larsson, RN at [facility] [phone].

## 2019-10-17 NOTE — Telephone Encounter (Signed)
Call placed to convey benefits. Spoke with the patient and conveyed the benefits. Patient understands/agreeable with the benefits.  Patient states she has a HSA account and will call back to schedule once she can use it.

## 2019-10-22 NOTE — Telephone Encounter (Signed)
Could you please call pt?  I would not recommend her waiting 4 months.  If she must, I will need to hand off her care to Dr. Oscar La or Edward Jolly and will need to talk with them so they know her hx when she does come.  Thanks.  If she waits, she needs to go in recall.  Thanks.

## 2019-10-23 NOTE — Telephone Encounter (Signed)
Spoke with patient. Advised per Dr. Hyacinth Meeker. Patient declines to schedule LEEP at this time, see account notes.  Patient also declines to schedule at a later date with another provider. Patient is requesting to schedule with Dr. Hyacinth Meeker at new location. Advised patient more details to come regarding Dr. Hyacinth Meeker and scheduling, scheduling will be at least 02/2020. Strongly encouraged scheduling LEEP.   Patient is requesting a return call from Dr. Hyacinth Meeker to review her results and recommendations. Patient states after 4pm is best. Advised I will forward to Dr. Hyacinth Meeker for return call.   No recall placed.   Dr. Hyacinth Meeker -please advise on recall, if needed, after speaking with patient.

## 2019-11-18 NOTE — Telephone Encounter (Addendum)
Per review of Epic, LEEP not scheduled to date.   Dr. Hyacinth Meeker -have you contacted this patient?

## 2019-11-19 ENCOUNTER — Ambulatory Visit: Payer: 59

## 2019-11-26 ENCOUNTER — Other Ambulatory Visit: Payer: Self-pay

## 2019-11-26 ENCOUNTER — Ambulatory Visit (INDEPENDENT_AMBULATORY_CARE_PROVIDER_SITE_OTHER): Payer: 59

## 2019-11-26 DIAGNOSIS — Z1231 Encounter for screening mammogram for malignant neoplasm of breast: Secondary | ICD-10-CM | POA: Diagnosis not present

## 2019-11-30 NOTE — Telephone Encounter (Signed)
I have called and left a message with this pt.  Ok to close encounter.

## 2019-12-01 NOTE — Telephone Encounter (Signed)
Encounter closed

## 2019-12-05 IMAGING — MG DIGITAL SCREENING BILATERAL MAMMOGRAM WITH IMPLANTS, CAD AND TOM
9 of 12 series · 9 of 28 positions shown · non-contrast
Comparison: Previous exam(s).

CLINICAL DATA: Screening.

EXAM:
DIGITAL SCREENING BILATERAL MAMMOGRAM WITH IMPLANTS, CAD AND TOMO
The patient has retropectoral implants. Standard and implant
displaced views were performed.

[L CC]
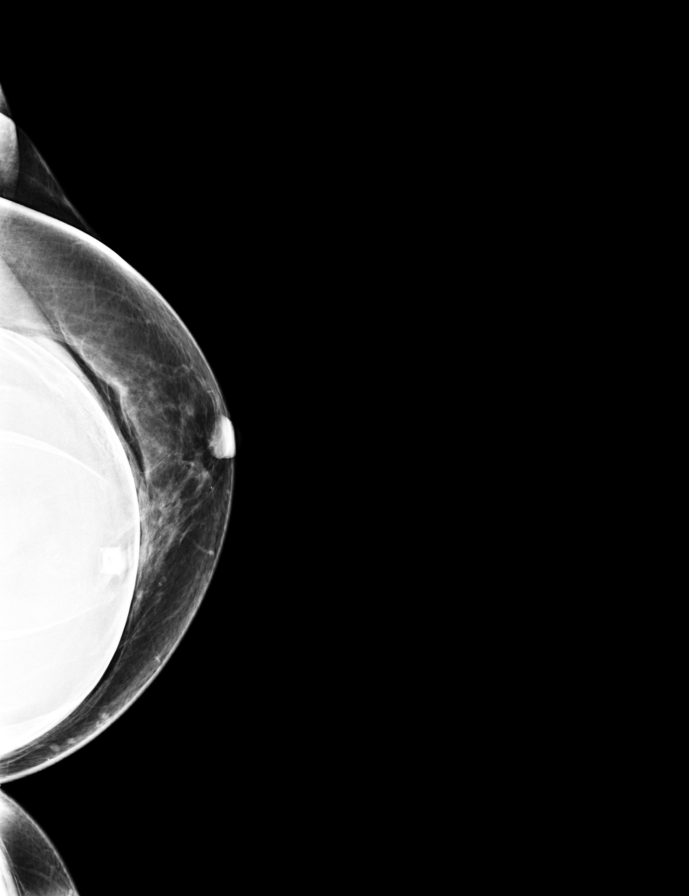

[R CC]
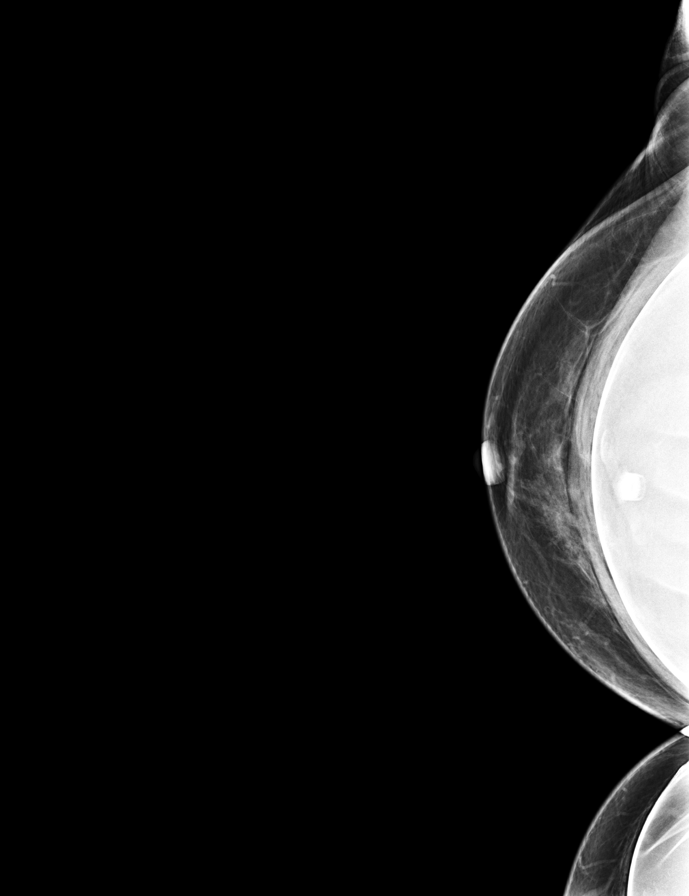

[L MLO]
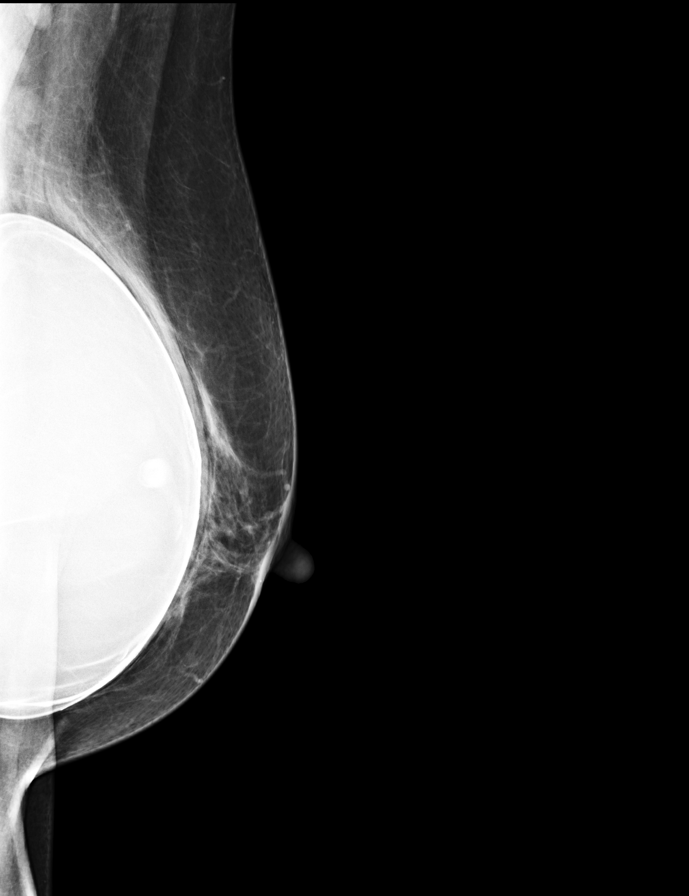

[R MLO]
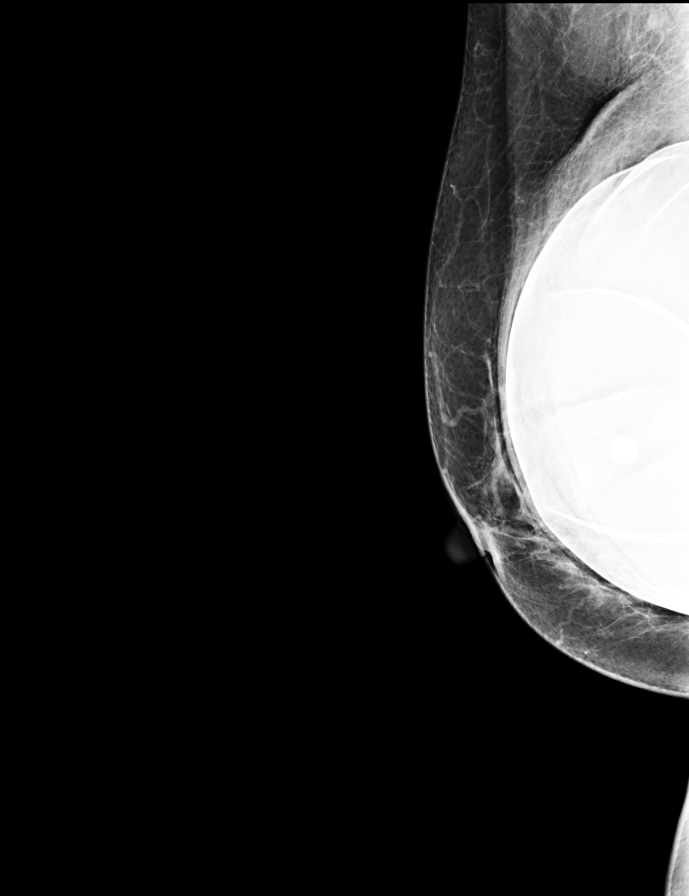

[L MLO synth-2D]
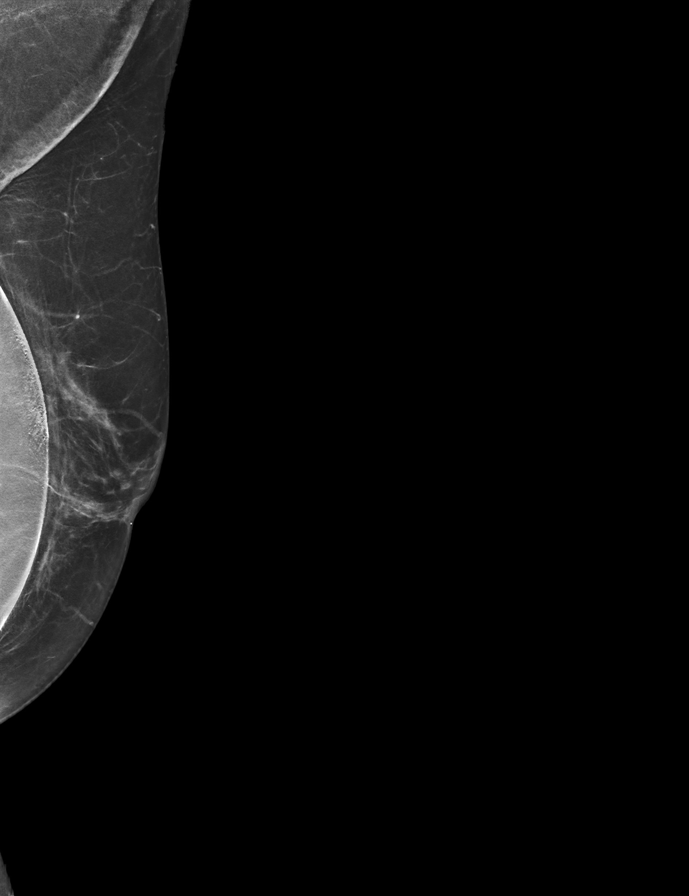

[L CC synth-2D]
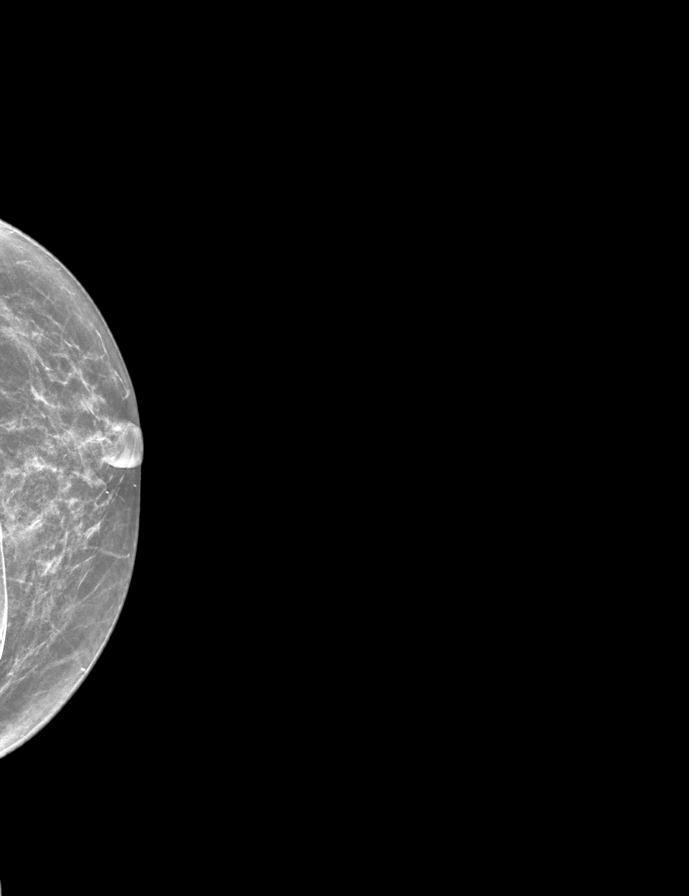

[R CC synth-2D]
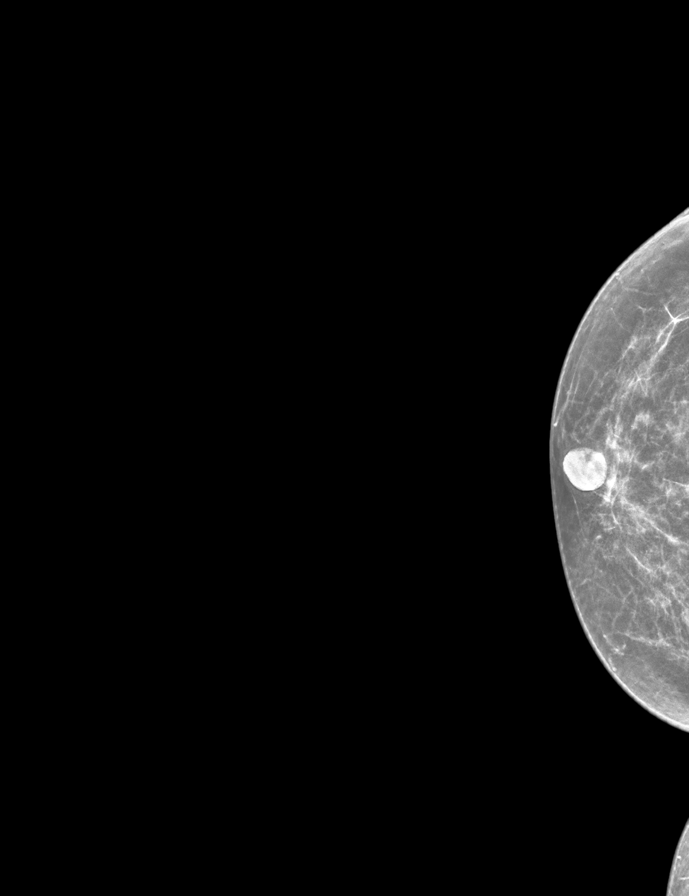

[R MLO synth-2D]
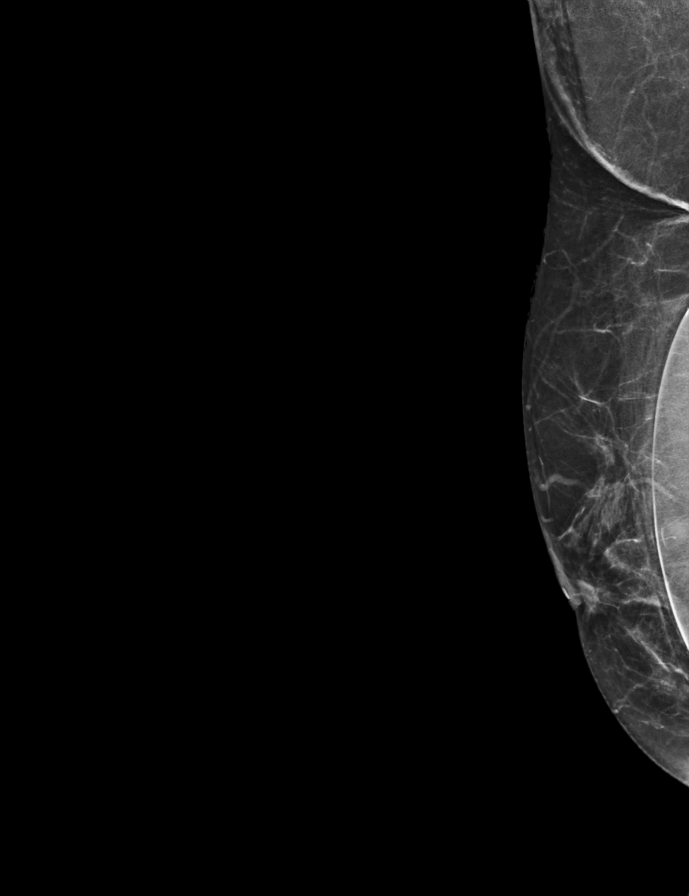

[L MLO tomo · tomo slice 29/57.0]
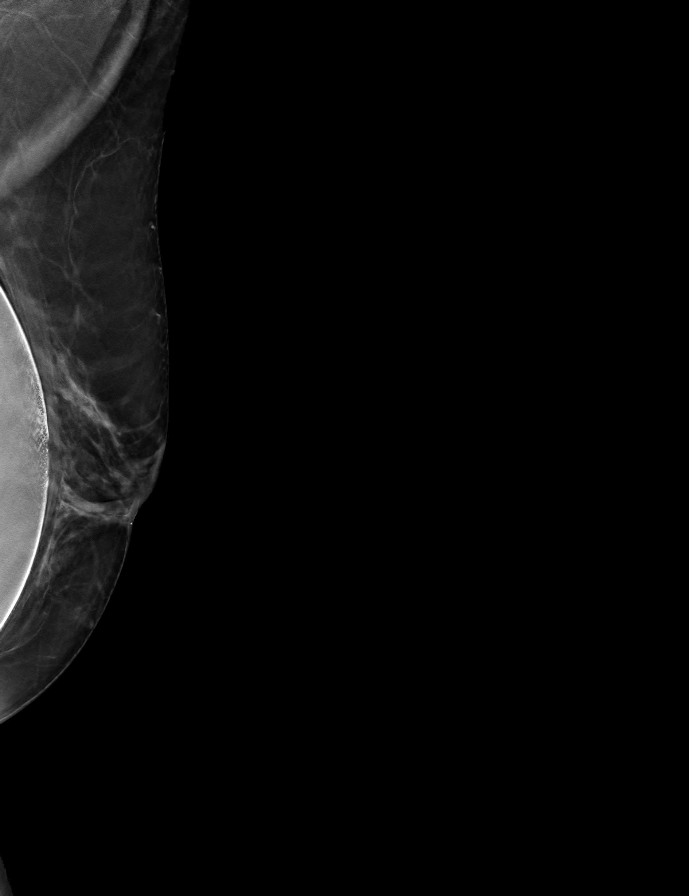

[9 of 28 positions shown; findings below may reference images not displayed]

ACR Breast Density Category b: There are scattered areas of
fibroglandular density.
FINDINGS: There are no findings suspicious for malignancy. Images were
processed with CAD.
IMPRESSION: No mammographic evidence of malignancy. A result letter of this
screening mammogram will be mailed directly to the patient.

RECOMMENDATION:
Screening mammogram in one year. (Code:60-T-8Z4)

BI-RADS CATEGORY  1:  Negative.

## 2019-12-05 NOTE — Telephone Encounter (Signed)
1 mo recall placed.  

## 2019-12-16 ENCOUNTER — Telehealth: Payer: Self-pay

## 2019-12-16 NOTE — Telephone Encounter (Signed)
Patient is calling to update records for Flu and Shingles shots.  Flu shot done (11/13/19) Shingles shot done (11/14/19)

## 2019-12-16 NOTE — Telephone Encounter (Signed)
Epic Immunization record updated.  Encounter closed

## 2019-12-22 ENCOUNTER — Telehealth: Payer: Self-pay

## 2019-12-22 NOTE — Telephone Encounter (Signed)
Spoke with patient. Patient received 1st Pfizer Covid 19 vaccine on 05/02/19, second on 05/27/19.  Encouraged Covid 19 booster when eligible, ultimately her decision. Questions answered regarding booster. Patient thankful for call.   Routing to provider for final review. Patient is agreeable to disposition. Will close encounter.

## 2019-12-22 NOTE — Telephone Encounter (Signed)
Patient want to check with Dr Hyacinth Meeker if she would advise her to get the covid booster. Please call work number [phone].

## 2020-08-20 ENCOUNTER — Ambulatory Visit: Payer: 59 | Admitting: Obstetrics & Gynecology

## 2020-10-12 ENCOUNTER — Other Ambulatory Visit: Payer: Self-pay | Admitting: Obstetrics & Gynecology

## 2020-10-12 DIAGNOSIS — Z1231 Encounter for screening mammogram for malignant neoplasm of breast: Secondary | ICD-10-CM

## 2020-12-01 ENCOUNTER — Ambulatory Visit (INDEPENDENT_AMBULATORY_CARE_PROVIDER_SITE_OTHER): Payer: 59

## 2020-12-01 ENCOUNTER — Other Ambulatory Visit: Payer: Self-pay

## 2020-12-01 DIAGNOSIS — Z1231 Encounter for screening mammogram for malignant neoplasm of breast: Secondary | ICD-10-CM | POA: Diagnosis not present

## 2021-01-10 ENCOUNTER — Encounter (HOSPITAL_BASED_OUTPATIENT_CLINIC_OR_DEPARTMENT_OTHER): Payer: Self-pay | Admitting: Obstetrics & Gynecology

## 2021-01-10 ENCOUNTER — Ambulatory Visit (INDEPENDENT_AMBULATORY_CARE_PROVIDER_SITE_OTHER): Payer: 59 | Admitting: Obstetrics & Gynecology

## 2021-01-10 ENCOUNTER — Other Ambulatory Visit: Payer: Self-pay

## 2021-01-10 ENCOUNTER — Other Ambulatory Visit (HOSPITAL_COMMUNITY)
Admission: RE | Admit: 2021-01-10 | Discharge: 2021-01-10 | Disposition: A | Discharge status: Home / Self Care | Payer: 59 | Source: Ambulatory Visit | Attending: Obstetrics & Gynecology | Admitting: Obstetrics & Gynecology

## 2021-01-10 VITALS — BP 150/94 | HR 80 | Ht 60.5 in | Wt 131.2 lb

## 2021-01-10 DIAGNOSIS — Z124 Encounter for screening for malignant neoplasm of cervix: Secondary | ICD-10-CM | POA: Insufficient documentation

## 2021-01-10 DIAGNOSIS — Z9889 Other specified postprocedural states: Secondary | ICD-10-CM | POA: Diagnosis not present

## 2021-01-10 DIAGNOSIS — M8580 Other specified disorders of bone density and structure, unspecified site: Secondary | ICD-10-CM

## 2021-01-10 DIAGNOSIS — Z01419 Encounter for gynecological examination (general) (routine) without abnormal findings: Secondary | ICD-10-CM | POA: Diagnosis not present

## 2021-01-10 DIAGNOSIS — B977 Papillomavirus as the cause of diseases classified elsewhere: Secondary | ICD-10-CM | POA: Diagnosis present

## 2021-01-10 DIAGNOSIS — Z780 Asymptomatic menopausal state: Secondary | ICD-10-CM

## 2021-01-10 DIAGNOSIS — E2839 Other primary ovarian failure: Secondary | ICD-10-CM

## 2021-01-10 DIAGNOSIS — R87612 Low grade squamous intraepithelial lesion on cytologic smear of cervix (LGSIL): Secondary | ICD-10-CM | POA: Diagnosis present

## 2021-01-10 NOTE — Progress Notes (Addendum)
60 y.o. G26P2002 Divorced White or Caucasian female here for annual exam.  Denies vaginal bleeding.  Doing well.    Her horse passed away last year so not riding much.    Patient's last menstrual period was 07/21/1996.          Sexually active: Yes.   Has been together 16 years The current method of family planning is post menopausal status.    Exercising: Yes.     walking Smoker:  no  Health Maintenance: Pap:  05/16/2019 Positive HPV, LISIL History of abnormal Pap:  yes MMG:  12/01/2020 Negative Colonoscopy:  12/23/2018, [facility], follow up 10 years BMD:   11/07/2017 Osteopenia Screening Labs: last done 2018.  Declines having this done today.   reports that she has never smoked. She has never used smokeless tobacco. She reports that she does not currently use alcohol. She reports that she does not use drugs.  Past Medical History:  Diagnosis Date   Abnormal Pap smear of cervix 08/2012   2014, 2021 LGSIL HPV HR + 16,18/45    Past Surgical History:  Procedure Laterality Date   AUGMENTATION MAMMAPLASTY     COLPOSCOPY W/ BIOPSY / CURETTAGE  10/2012   path - high grade SIL   PLACEMENT OF BREAST IMPLANTS Bilateral 2002   TONSILLECTOMY  age 63    Current Outpatient Medications  Medication Sig Dispense Refill   Ascorbic Acid (VITAMIN C) 1000 MG tablet Take 1,000 mg by mouth daily.     Cyanocobalamin (B-12 PO) Take by mouth.     Fish Oil-Cholecalciferol (OMEGA-3 GUMMIES PO) Take by mouth daily.     FOLIC ACID PO Take by mouth daily.     Multiple Vitamins-Minerals (ZINC PO) Take by mouth.     Multiple Vitamins-Minerals (AIRBORNE PO) Take by mouth.     No current facility-administered medications for this visit.    Family History  Problem Relation Age of Onset   Hypertension Father    Diabetes Other    Aneurysm Other     Review of Systems  All other systems reviewed and are negative.  Exam:   BP (!) 178/108 (BP Location: Right Arm, Patient Position: Sitting, Cuff  Size: Normal)   Pulse 80   Ht 5' 0.5" (1.537 m)   Wt 131 lb 3.2 oz (59.5 kg)   LMP 07/21/1996   BMI 25.20 kg/m   Height: 5' 0.5" (153.7 cm)  General appearance: alert, cooperative and appears stated age Head: Normocephalic, without obvious abnormality, atraumatic Neck: no adenopathy, supple, symmetrical, trachea midline and thyroid normal to inspection and palpation Lungs: clear to auscultation bilaterally Breasts: normal appearance, no masses or tenderness Heart: regular rate and rhythm Abdomen: soft, non-tender; bowel sounds normal; no masses,  no organomegaly Extremities: extremities normal, atraumatic, no cyanosis or edema Skin: Skin color, texture, turgor normal. No rashes or lesions Lymph nodes: Cervical, supraclavicular, and axillary nodes normal. No abnormal inguinal nodes palpated Neurologic: Grossly normal   Pelvic: External genitalia:  no lesions              Urethra:  normal appearing urethra with no masses, tenderness or lesions              Bartholins and Skenes: normal                 Vagina: normal appearing vagina with normal color and no discharge, no lesions              Cervix: no lesions  Pap taken: Yes.   Bimanual Exam:  Uterus:  normal size, contour, position, consistency, mobility, non-tender              Adnexa: normal adnexa and no mass, fullness, tenderness               Rectovaginal: Confirms               Anus:  normal sphincter tone, no lesions  Chaperone, Ina Homes, CMA, was present for exam.  Assessment/Plan: 1. Well woman exam with routine gynecological exam - pap with HR HPV obtained today - MMG 11/2020 - BMD 2019 - declines lab work today - vaccines/care gaps updated/reviewed  2. Postmenopausal - no HRT  3. 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 Osteopenia, unspecified location - plan to repeat 1-2 years  5. Premature ovarian insufficiency - at age 72  6.  Elevated blood pressure - pt is going to monitor this at work  and let me know

## 2021-01-10 NOTE — Patient Instructions (Signed)
Please check your blood pressure a few times over the next few weeks and let me know.  Hypertension diagnosis is >135/85.

## 2021-01-20 LAB — CYTOLOGY - PAP
Comment: NEGATIVE
Comment: NEGATIVE
HPV 16: NEGATIVE
HPV 18 / 45: NEGATIVE
High risk HPV: POSITIVE — AB

## 2021-04-11 ENCOUNTER — Encounter (HOSPITAL_BASED_OUTPATIENT_CLINIC_OR_DEPARTMENT_OTHER): Payer: Self-pay | Admitting: *Deleted

## 2021-10-18 ENCOUNTER — Other Ambulatory Visit: Payer: Self-pay | Admitting: Obstetrics & Gynecology

## 2021-10-18 DIAGNOSIS — Z1231 Encounter for screening mammogram for malignant neoplasm of breast: Secondary | ICD-10-CM

## 2021-12-08 ENCOUNTER — Ambulatory Visit (INDEPENDENT_AMBULATORY_CARE_PROVIDER_SITE_OTHER): Payer: 59

## 2021-12-08 DIAGNOSIS — Z1231 Encounter for screening mammogram for malignant neoplasm of breast: Secondary | ICD-10-CM | POA: Diagnosis not present

## 2022-01-16 ENCOUNTER — Encounter (HOSPITAL_BASED_OUTPATIENT_CLINIC_OR_DEPARTMENT_OTHER): Payer: Self-pay | Admitting: Obstetrics & Gynecology

## 2022-01-16 ENCOUNTER — Other Ambulatory Visit (HOSPITAL_COMMUNITY)
Admission: RE | Admit: 2022-01-16 | Discharge: 2022-01-16 | Disposition: A | Discharge status: Home / Self Care | Payer: 59 | Source: Ambulatory Visit | Attending: Obstetrics & Gynecology | Admitting: Obstetrics & Gynecology

## 2022-01-16 ENCOUNTER — Ambulatory Visit (INDEPENDENT_AMBULATORY_CARE_PROVIDER_SITE_OTHER): Payer: 59 | Admitting: Obstetrics & Gynecology

## 2022-01-16 VITALS — BP 145/90 | HR 75 | Ht 60.5 in | Wt 131.0 lb

## 2022-01-16 DIAGNOSIS — E2839 Other primary ovarian failure: Secondary | ICD-10-CM

## 2022-01-16 DIAGNOSIS — M8580 Other specified disorders of bone density and structure, unspecified site: Secondary | ICD-10-CM | POA: Diagnosis not present

## 2022-01-16 DIAGNOSIS — Z9889 Other specified postprocedural states: Secondary | ICD-10-CM | POA: Insufficient documentation

## 2022-01-16 DIAGNOSIS — B977 Papillomavirus as the cause of diseases classified elsewhere: Secondary | ICD-10-CM | POA: Insufficient documentation

## 2022-01-16 DIAGNOSIS — Z01419 Encounter for gynecological examination (general) (routine) without abnormal findings: Secondary | ICD-10-CM

## 2022-01-16 DIAGNOSIS — R030 Elevated blood-pressure reading, without diagnosis of hypertension: Secondary | ICD-10-CM

## 2022-01-16 NOTE — Progress Notes (Signed)
61 y.o. G78P2002 Divorced White or Caucasian female here for annual exam.  Denies vaginal bleeding.  Did have abnormal pap smear last year.    Is doing regular line dancing.    Patient's last menstrual period was 07/21/1996.          Sexually active: Yes.    The current method of family planning is post menopausal status.    Exercising: yes Smoker:  no  Health Maintenance: Pap:  01/10/2021 Positive High Risk HPV History of abnormal Pap:  yes MMG:  12/08/2021 Negative Colonoscopy:  12/23/2018, follow up 10 years BMD:   11/07/2017 Osteopenia, -1.8 Screening Labs: declines doing blood work   reports that she has never smoked. She has never used smokeless tobacco. She reports that she does not currently use alcohol. She reports that she does not use drugs.  Past Medical History:  Diagnosis Date   Abnormal Pap smear of cervix 08/2012   2014, 2021 LGSIL HPV HR + 16,18/45    Past Surgical History:  Procedure Laterality Date   AUGMENTATION MAMMAPLASTY     COLPOSCOPY W/ BIOPSY / CURETTAGE  10/2012   path - high grade SIL   PLACEMENT OF BREAST IMPLANTS Bilateral 2002   TONSILLECTOMY  age 48    Current Outpatient Medications  Medication Sig Dispense Refill   Apoaequorin (PREVAGEN PO) Take by mouth.     Ascorbic Acid (VITAMIN C) 1000 MG tablet Take 1,000 mg by mouth daily.     calcium carbonate (OS-CAL - DOSED IN MG OF ELEMENTAL CALCIUM) 1250 (500 Ca) MG tablet Take 1 tablet by mouth.     Cholecalciferol (VITAMIN D-3) 125 MCG (5000 UT) TABS Take by mouth.     Cyanocobalamin (B-12 PO) Take by mouth.     Fish Oil-Cholecalciferol (OMEGA-3 GUMMIES PO) Take by mouth daily.     FOLIC ACID PO Take by mouth daily.     Magnesium 200 MG CHEW Chew by mouth.     Multiple Vitamins-Minerals (AIRBORNE PO) Take by mouth.     Multiple Vitamins-Minerals (ZINC PO) Take by mouth.     Turmeric (QC TUMERIC COMPLEX PO) Take by mouth.     No current facility-administered medications for this visit.     Family History  Problem Relation Age of Onset   Hypertension Father    Diabetes Other    Aneurysm Other     ROS: Constitutional: negative Genitourinary:negative  Exam:   BP (!) 148/88   Pulse 75   Ht 5' 0.5" (1.537 m)   Wt 131 lb (59.4 kg)   LMP 07/21/1996   BMI 25.16 kg/m   Height: 5' 0.5" (153.7 cm)  General appearance: alert, cooperative and appears stated age Head: Normocephalic, without obvious abnormality, atraumatic Neck: no adenopathy, supple, symmetrical, trachea midline and thyroid normal to inspection and palpation Lungs: clear to auscultation bilaterally Breasts: normal appearance, no masses or tenderness Heart: regular rate and rhythm Abdomen: soft, non-tender; bowel sounds normal; no masses,  no organomegaly Extremities: extremities normal, atraumatic, no cyanosis or edema Skin: Skin color, texture, turgor normal. No rashes or lesions Lymph nodes: Cervical, supraclavicular, and axillary nodes normal. No abnormal inguinal nodes palpated Neurologic: Grossly normal   Pelvic: External genitalia:  no lesions              Urethra:  normal appearing urethra with no masses, tenderness or lesions              Bartholins and Skenes: normal  Vagina: normal appearing vagina with normal color and no discharge, no lesions              Cervix: no lesions              Pap taken: Yes.   Bimanual Exam:  Uterus:  normal size, contour, position, consistency, mobility, non-tender              Adnexa: normal adnexa and no mass, fullness, tenderness               Rectovaginal: Confirms               Anus:  normal sphincter tone, no lesions  Chaperone, Ina Homes, CMA, was present for exam.  Assessment/Plan: 1. Well woman exam with routine gynecological exam - Pap smear and HR HPV obtained  - Mammogram 11/2021 - Colonoscopy 12/2018 - Bone mineral density 2019 - lab work declined - vaccines reviewed/updated  2. H/O LEEP  3. High risk HPV  infection - Cytology - PAP( [place])  4. Osteopenia, unspecified location- - DG BONE DENSITY (DXA); Future  5. Premature ovarian failure  6.  Elevated blood pressure - pt is going to start checking at home.

## 2022-01-18 LAB — CYTOLOGY - PAP
Comment: NEGATIVE
Comment: NEGATIVE
Comment: NEGATIVE
Diagnosis: UNDETERMINED — AB
HPV 16: NEGATIVE
HPV 18 / 45: NEGATIVE
High risk HPV: POSITIVE — AB

## 2022-01-25 ENCOUNTER — Other Ambulatory Visit (HOSPITAL_BASED_OUTPATIENT_CLINIC_OR_DEPARTMENT_OTHER): Payer: Self-pay | Admitting: Obstetrics & Gynecology

## 2022-12-05 ENCOUNTER — Other Ambulatory Visit: Payer: Self-pay | Admitting: Obstetrics & Gynecology

## 2022-12-05 DIAGNOSIS — Z1231 Encounter for screening mammogram for malignant neoplasm of breast: Secondary | ICD-10-CM

## 2023-01-10 ENCOUNTER — Ambulatory Visit: Payer: 59

## 2023-01-10 DIAGNOSIS — Z1231 Encounter for screening mammogram for malignant neoplasm of breast: Secondary | ICD-10-CM | POA: Diagnosis not present

## 2023-01-10 DIAGNOSIS — M8580 Other specified disorders of bone density and structure, unspecified site: Secondary | ICD-10-CM | POA: Diagnosis not present

## 2023-02-05 ENCOUNTER — Encounter (HOSPITAL_BASED_OUTPATIENT_CLINIC_OR_DEPARTMENT_OTHER): Payer: Self-pay

## 2023-02-05 ENCOUNTER — Ambulatory Visit (HOSPITAL_BASED_OUTPATIENT_CLINIC_OR_DEPARTMENT_OTHER): Payer: 59 | Admitting: Obstetrics & Gynecology

## 2023-03-27 ENCOUNTER — Ambulatory Visit (HOSPITAL_BASED_OUTPATIENT_CLINIC_OR_DEPARTMENT_OTHER): Payer: 59 | Admitting: Obstetrics & Gynecology

## 2023-04-11 ENCOUNTER — Ambulatory Visit (HOSPITAL_BASED_OUTPATIENT_CLINIC_OR_DEPARTMENT_OTHER): Payer: 59 | Admitting: Obstetrics & Gynecology

## 2023-04-20 ENCOUNTER — Ambulatory Visit (HOSPITAL_BASED_OUTPATIENT_CLINIC_OR_DEPARTMENT_OTHER): Payer: 59 | Admitting: Obstetrics & Gynecology

## 2023-04-20 ENCOUNTER — Encounter (HOSPITAL_BASED_OUTPATIENT_CLINIC_OR_DEPARTMENT_OTHER): Payer: Self-pay | Admitting: Obstetrics & Gynecology

## 2023-04-20 ENCOUNTER — Other Ambulatory Visit (HOSPITAL_COMMUNITY)
Admission: RE | Admit: 2023-04-20 | Discharge: 2023-04-20 | Disposition: A | Discharge status: Home / Self Care | Source: Ambulatory Visit | Attending: Obstetrics & Gynecology | Admitting: Obstetrics & Gynecology

## 2023-04-20 VITALS — BP 138/80 | HR 79 | Ht 60.5 in | Wt 125.6 lb

## 2023-04-20 DIAGNOSIS — M8588 Other specified disorders of bone density and structure, other site: Secondary | ICD-10-CM | POA: Diagnosis not present

## 2023-04-20 DIAGNOSIS — Z124 Encounter for screening for malignant neoplasm of cervix: Secondary | ICD-10-CM | POA: Insufficient documentation

## 2023-04-20 DIAGNOSIS — B977 Papillomavirus as the cause of diseases classified elsewhere: Secondary | ICD-10-CM | POA: Diagnosis present

## 2023-04-20 DIAGNOSIS — Z01419 Encounter for gynecological examination (general) (routine) without abnormal findings: Secondary | ICD-10-CM

## 2023-04-20 DIAGNOSIS — Z780 Asymptomatic menopausal state: Secondary | ICD-10-CM

## 2023-04-20 NOTE — Progress Notes (Signed)
 ANNUAL EXAM Patient name: Cristina Wyatt. Baucum MRN [id number]  Date of birth: March 14, 1960 Chief Complaint:   Gynecologic Exam  History of Present Illness:   Cristina Wyatt. Cristina Wyatt is a 63 y.o. G48P2002 Caucasian female being seen today for a routine annual exam.  Doing well.  Denies vaginal bleeding.  She and significant other are together in a new home.  They've been there almost a year.  Continues to have lab work offered at her work.  She did this in September.  She reports this was all normal.   Patient's last menstrual period was 07/21/1996.  Last pap 2023. Results were:  neg with +HR HPV .  Last mammogram: 12/2022. Results were: normal. Family h/o breast cancer: no Last colonoscopy: 12/2018. Results were: normal. Family h/o colorectal cancer: no BMD:  osteopenia, t Score -2.0     04/20/2023   11:17 AM 01/16/2022    2:37 PM 01/10/2021    4:03 PM  Depression screen PHQ 2/9  Decreased Interest 0 0 0  Down, Depressed, Hopeless 0 0 0  PHQ - 2 Score 0 0 0    Review of Systems:   Pertinent items are noted in HPI  Denies any headaches, blurred vision, fatigue, shortness of breath, chest pain, abdominal pain, abnormal vaginal discharge/itching/odor/irritation, problems with periods, bowel movements, urination, or intercourse unless otherwise stated above.  Pertinent History Reviewed:  Reviewed past medical,surgical, social and family history.   Reviewed problem list, medications and allergies. Physical Assessment:   Vitals:   04/20/23 1105 04/20/23 1135  BP: (!) 172/98 138/80  Pulse: 79   Weight: 125 lb 9.6 oz (57 kg)   Height: 5' 0.5" (1.537 m)   Body mass index is 24.13 kg/m.        Physical Examination:   General appearance - well appearing, and in no distress  Mental status - alert, oriented to person, place, and time  Psych:  She has a normal mood and affect  Skin - warm and dry, normal color, no suspicious lesions noted  Chest - effort normal, all lung fields clear to  auscultation bilaterally  Heart - normal rate and regular rhythm  Neck:  midline trachea, no thyromegaly or nodules  Breasts - breasts appear normal, no suspicious masses, no skin or nipple changes or  axillary nodes  Abdomen - soft, nontender, nondistended, no masses or organomegaly  Pelvic - VULVA: normal appearing vulva with no masses, tenderness or lesions   VAGINA: normal appearing vagina with normal color and discharge, no lesions   CERVIX: normal appearing cervix without discharge or lesions, no CMT  Thin prep pap is done with HR HPV cotesting  UTERUS: uterus is felt to be normal size, shape, consistency and nontender   ADNEXA: No adnexal masses or tenderness noted.  Rectal - normal rectal, good sphincter tone, no masses felt.  Extremities:  No swelling or varicosities noted  Chaperone present for exam  Assessment & Plan:  1. Well woman exam with routine gynecological exam (Primary) - Pap smear and HR HPV obtained today - Mammogram 12/2022 - Colonoscopy 12/2018.  Follow up 10 years. - Bone mineral density reviewed with pt.  Done 12/2022. - lab work done with wellness program at work - vaccines reviewed/updated   2. Cervical cancer screening - Cytology - PAP( [place])  3. High risk HPV infection  4. Postmenopausal  5. Osteopenia of lumbar spine   Meds: No orders of the defined types were placed in this encounter.   Follow-up:  Return in about 1 year (around 04/19/2024).  Jerene Bears, MD 04/20/2023 11:43 AM

## 2023-04-25 LAB — CYTOLOGY - PAP
Comment: NEGATIVE
Comment: NEGATIVE
Comment: NEGATIVE
Diagnosis: UNDETERMINED — AB
HPV 16: NEGATIVE
HPV 18 / 45: NEGATIVE
High risk HPV: POSITIVE — AB

## 2023-04-26 ENCOUNTER — Encounter (HOSPITAL_BASED_OUTPATIENT_CLINIC_OR_DEPARTMENT_OTHER): Payer: Self-pay | Admitting: Obstetrics & Gynecology

## 2023-05-02 ENCOUNTER — Encounter (HOSPITAL_BASED_OUTPATIENT_CLINIC_OR_DEPARTMENT_OTHER): Payer: Self-pay | Admitting: *Deleted

## 2023-05-25 ENCOUNTER — Ambulatory Visit (HOSPITAL_BASED_OUTPATIENT_CLINIC_OR_DEPARTMENT_OTHER): Admitting: Obstetrics & Gynecology

## 2023-05-25 VITALS — BP 132/72 | HR 74 | Ht 61.0 in | Wt 129.2 lb

## 2023-05-25 DIAGNOSIS — R87810 Cervical high risk human papillomavirus (HPV) DNA test positive: Secondary | ICD-10-CM | POA: Diagnosis not present

## 2023-05-25 DIAGNOSIS — R87610 Atypical squamous cells of undetermined significance on cytologic smear of cervix (ASC-US): Secondary | ICD-10-CM

## 2023-05-25 DIAGNOSIS — N952 Postmenopausal atrophic vaginitis: Secondary | ICD-10-CM

## 2023-05-25 DIAGNOSIS — N882 Stricture and stenosis of cervix uteri: Secondary | ICD-10-CM

## 2023-05-25 MED ORDER — ESTRADIOL 0.1 MG/GM VA CREA: TOPICAL_CREAM | VAGINAL | 0 refills | Status: DC

## 2023-05-25 NOTE — Progress Notes (Signed)
 63 y.o. [id number] Divorced Not Hispanic or Latino female here for colposcopy with possible biopsies and/or ECC due to ASCUS Pap with HR HPV obtained 04/20/2023.    Prior evaluation/treatment:  LEEP in 2015  Patient's last menstrual period was 07/21/1996.          Sexually active: Yes.    The current method of family planning is post menopausal status.     Patient has been counseled about results and procedure.  Risks and benefits have bene reviewed including immediate and/or delayed bleeding, infection, cervical scaring from procedure, possibility of needing additional follow up as well as treatment.  Rare risks of missing a lesion discussed as well.  All questions answered.  Pt ready to proceed.  Consent obtained.  BP 132/72   Pulse 74   Ht 5\' 1"  (1.549 m)   Wt 129 lb 3.2 oz (58.6 kg)   LMP 07/21/1996   BMI 24.41 kg/m   General appearance: alert, cooperative and appears stated age Lymph nodes: No abnormal inguinal nodes palpated Neurologic: Grossly normal  Pelvic: External genitalia:  no lesions              Urethra:  normal appearing urethra with no masses, tenderness or lesions              Bartholins and Skenes: normal                 Vagina: normal appearing vagina with normal color and no discharge, no lesions               Physical Exam Constitutional:      Appearance: Normal appearance.  Genitourinary:    Vagina: No lesions.     Comments: Atrophic tissue.  Cervical stenosis noted. Neurological:     Mental Status: She is alert.    Speculum placed.  3% acetic acid applied to cervix for >45 seconds.  Cervix visualized with both 7.5X and 15X magnification.  Green filter also used.  Lugols solution was not used.  Findings:  no AWE.  Cervical stenosis noted.  Tenaculum applied.  Attempted cervical dilation. Pt was uncomfortable and this was unsuccessful.  Discussed with pt.  Will treat with vaginal estrogen cream and try to proceed with ECC at that time.  Chaperone, Raechel Ache,  RN, was present during procedure.  Assessment/Plan: 1. 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 Cervical stenosis (uterine cervix) - will treat with vaginal estrogen cream and have her return in 3 months for possible ECC then - estradiol (ESTRACE) 0.1 MG/GM vaginal cream; 1 gram vaginally twice weekly  Dispense: 42.5 g; Refill: 0  3. Vaginal atrophy

## 2023-05-25 NOTE — Patient Instructions (Addendum)
 Start using vaginal estrogen cream mid May.  One gram vaginally twice weekly.

## 2023-08-31 ENCOUNTER — Encounter (HOSPITAL_BASED_OUTPATIENT_CLINIC_OR_DEPARTMENT_OTHER): Payer: Self-pay | Admitting: Obstetrics & Gynecology

## 2023-08-31 ENCOUNTER — Ambulatory Visit (HOSPITAL_BASED_OUTPATIENT_CLINIC_OR_DEPARTMENT_OTHER): Admitting: Obstetrics & Gynecology

## 2023-08-31 ENCOUNTER — Other Ambulatory Visit (HOSPITAL_BASED_OUTPATIENT_CLINIC_OR_DEPARTMENT_OTHER): Payer: Self-pay

## 2023-08-31 ENCOUNTER — Other Ambulatory Visit (HOSPITAL_COMMUNITY)
Admission: RE | Admit: 2023-08-31 | Discharge: 2023-08-31 | Disposition: A | Discharge status: Home / Self Care | Source: Ambulatory Visit | Attending: Obstetrics & Gynecology | Admitting: Obstetrics & Gynecology

## 2023-08-31 VITALS — HR 86

## 2023-08-31 DIAGNOSIS — R87610 Atypical squamous cells of undetermined significance on cytologic smear of cervix (ASC-US): Secondary | ICD-10-CM | POA: Insufficient documentation

## 2023-08-31 DIAGNOSIS — N882 Stricture and stenosis of cervix uteri: Secondary | ICD-10-CM

## 2023-08-31 DIAGNOSIS — N952 Postmenopausal atrophic vaginitis: Secondary | ICD-10-CM | POA: Diagnosis not present

## 2023-08-31 DIAGNOSIS — R87810 Cervical high risk human papillomavirus (HPV) DNA test positive: Secondary | ICD-10-CM

## 2023-08-31 MED ORDER — ESTRADIOL 0.1 MG/GM VA CREA
1.0000 g | TOPICAL_CREAM | VAGINAL | 3 refills | Status: AC
  Filled 2023-08-31 – 2023-09-24 (×2): qty 42.5, 90d supply, fill #0

## 2023-08-31 NOTE — Progress Notes (Unsigned)
 GYNECOLOGY  VISIT  CC:   ECC  HPI: 63 y.o. [id number] Divorced White or Caucasian female here for ECC due to inadequate colposcopy due to cervical stenosis.  She has been using vaginal estrogen cream.  Reports she has some vaginal burning with the first administration.  This improved over time.   Pt has questions about HPV.  She read on the internet that this is a parasite and that skin lesions are eggs hatching.  Detox product recommended by website.  Discussed with pt this is a virus and I would not recommend using the detox product.     Past Medical History:  Diagnosis Date   Abnormal Pap smear of cervix 08/2012   2014, 2021 LGSIL HPV HR + 16,18/45    MEDS:   Current Outpatient Medications on File Prior to Visit  Medication Sig Dispense Refill   Apoaequorin (PREVAGEN PO) Take by mouth.     Ascorbic Acid (VITAMIN C) 1000 MG tablet Take 1,000 mg by mouth daily.     calcium carbonate (OS-CAL - DOSED IN MG OF ELEMENTAL CALCIUM) 1250 (500 Ca) MG tablet Take 1 tablet by mouth.     Cholecalciferol (VITAMIN D-3) 125 MCG (5000 UT) TABS Take by mouth.     Cyanocobalamin (B-12 PO) Take by mouth.     Fish Oil-Cholecalciferol (OMEGA-3 GUMMIES PO) Take by mouth daily.     FOLIC ACID PO Take by mouth daily.     Magnesium 200 MG CHEW Chew by mouth.     Multiple Vitamins-Minerals (ZINC PO) Take by mouth.     Turmeric (QC TUMERIC COMPLEX PO) Take by mouth.     No current facility-administered medications on file prior to visit.    ALLERGIES: Other  SH:  ***  ROS  PHYSICAL EXAMINATION:    Pulse 86   LMP 07/21/1996     General appearance: alert, cooperative and appears stated age Neck: no adenopathy, supple, symmetrical, trachea midline and thyroid {CHL AMB PHY EX THYROID NORM DEFAULT:[phone]::normal to inspection and palpation} CV:  {Exam; heart brief:31539} Lungs:  {pe lungs ob:314451} Breasts: {Exam; breast:13139::normal appearance, no masses or tenderness} Abdomen: soft,  non-tender; bowel sounds normal; no masses,  no organomegaly Lymph:  no inguinal LAD noted  Pelvic: External genitalia:  no lesions              Urethra:  normal appearing urethra with no masses, tenderness or lesions              Bartholins and Skenes: normal                 Vagina: {exam; pelvic vaginal:30846}              Cervix: {CHL AMB PHY EX CERVIX NORM [phone]::no lesions}              Bimanual Exam:  Uterus:  {CHL AMB PHY EX UTERUS NORM [phone]::normal size, contour, position, consistency, mobility, non-tender}              Adnexa: {CHL AMB PHY EX ADNEXA NO MASS DEFAULT:[phone]::no mass, fullness, tenderness}              Rectovaginal: {yes no:[id number]}.  Confirms.              Anus:  normal sphincter tone, no lesions  Chaperone was present for exam.  Assessment/Plan: 1. Cervical stenosis (uterine cervix)  2. Vaginal atrophy (Primary) - estradiol  (ESTRACE ) 0.1 MG/GM vaginal cream; Place 1 g vaginally 2 (two) times a  week.  Dispense: 42.5 g; Refill: 3  3. Atypical squamous cell changes of undetermined significance (ASCUS) on cervical cytology with positive high risk human papilloma virus (HPV) - Surgical pathology( [place]/ [facility])

## 2023-09-04 ENCOUNTER — Ambulatory Visit (HOSPITAL_BASED_OUTPATIENT_CLINIC_OR_DEPARTMENT_OTHER): Payer: Self-pay | Admitting: Obstetrics & Gynecology

## 2023-09-04 LAB — SURGICAL PATHOLOGY

## 2023-09-05 ENCOUNTER — Telehealth (HOSPITAL_BASED_OUTPATIENT_CLINIC_OR_DEPARTMENT_OTHER): Payer: Self-pay | Admitting: Certified Nurse Midwife

## 2023-09-05 NOTE — Telephone Encounter (Signed)
 CNM able to contact pt at work. Pt aware ECC negative. She will plan annual gyn exam with repeat pap smear 03/2024 (scheduled).  Arland MARLA Roller

## 2023-09-24 ENCOUNTER — Other Ambulatory Visit (HOSPITAL_BASED_OUTPATIENT_CLINIC_OR_DEPARTMENT_OTHER): Payer: Self-pay

## 2023-10-04 ENCOUNTER — Other Ambulatory Visit (HOSPITAL_BASED_OUTPATIENT_CLINIC_OR_DEPARTMENT_OTHER): Payer: Self-pay

## 2023-12-11 ENCOUNTER — Other Ambulatory Visit (HOSPITAL_BASED_OUTPATIENT_CLINIC_OR_DEPARTMENT_OTHER): Payer: Self-pay | Admitting: Obstetrics & Gynecology

## 2023-12-11 DIAGNOSIS — Z1231 Encounter for screening mammogram for malignant neoplasm of breast: Secondary | ICD-10-CM

## 2024-01-15 ENCOUNTER — Ambulatory Visit (HOSPITAL_BASED_OUTPATIENT_CLINIC_OR_DEPARTMENT_OTHER)
Admission: RE | Admit: 2024-01-15 | Discharge: 2024-01-15 | Disposition: A | Discharge status: Home / Self Care | Source: Ambulatory Visit | Attending: Obstetrics & Gynecology | Admitting: Obstetrics & Gynecology

## 2024-01-15 ENCOUNTER — Encounter (HOSPITAL_BASED_OUTPATIENT_CLINIC_OR_DEPARTMENT_OTHER): Payer: Self-pay

## 2024-01-15 DIAGNOSIS — Z1231 Encounter for screening mammogram for malignant neoplasm of breast: Secondary | ICD-10-CM | POA: Diagnosis present

## 2024-01-22 ENCOUNTER — Other Ambulatory Visit: Payer: Self-pay | Admitting: Obstetrics & Gynecology

## 2024-01-22 DIAGNOSIS — R928 Other abnormal and inconclusive findings on diagnostic imaging of breast: Secondary | ICD-10-CM

## 2024-01-29 ENCOUNTER — Ambulatory Visit (HOSPITAL_BASED_OUTPATIENT_CLINIC_OR_DEPARTMENT_OTHER): Payer: Self-pay | Admitting: Obstetrics & Gynecology

## 2024-02-04 ENCOUNTER — Other Ambulatory Visit: Payer: Self-pay | Admitting: Obstetrics & Gynecology

## 2024-02-04 ENCOUNTER — Inpatient Hospital Stay
Admission: RE | Admit: 2024-02-04 | Discharge: 2024-02-04 | Attending: Obstetrics & Gynecology | Admitting: Obstetrics & Gynecology

## 2024-02-04 DIAGNOSIS — R928 Other abnormal and inconclusive findings on diagnostic imaging of breast: Secondary | ICD-10-CM

## 2024-02-11 ENCOUNTER — Encounter: Payer: Self-pay | Admitting: Obstetrics & Gynecology

## 2024-05-09 ENCOUNTER — Ambulatory Visit (HOSPITAL_BASED_OUTPATIENT_CLINIC_OR_DEPARTMENT_OTHER): Admitting: Obstetrics & Gynecology

## 2024-08-05 ENCOUNTER — Other Ambulatory Visit

## 2025-05-08 ENCOUNTER — Ambulatory Visit (HOSPITAL_BASED_OUTPATIENT_CLINIC_OR_DEPARTMENT_OTHER): Admitting: Obstetrics & Gynecology
